# Patient Record
Sex: Male | Born: 1950 | Race: White | Hispanic: No | Marital: Married | Smoking: Former smoker
Health system: Southern US, Community
[De-identification: ages and names within clinical notes are randomized; demographics above are authoritative.]

## PROBLEM LIST (undated history)

## (undated) DIAGNOSIS — M1990 Unspecified osteoarthritis, unspecified site: Secondary | ICD-10-CM

## (undated) DIAGNOSIS — J449 Chronic obstructive pulmonary disease, unspecified: Secondary | ICD-10-CM

## (undated) DIAGNOSIS — G4730 Sleep apnea, unspecified: Secondary | ICD-10-CM

## (undated) DIAGNOSIS — K439 Ventral hernia without obstruction or gangrene: Secondary | ICD-10-CM

## (undated) DIAGNOSIS — K219 Gastro-esophageal reflux disease without esophagitis: Secondary | ICD-10-CM

## (undated) DIAGNOSIS — I10 Essential (primary) hypertension: Secondary | ICD-10-CM

## (undated) HISTORY — PX: VARICOSE VEIN SURGERY: SHX832

## (undated) HISTORY — PX: EYE SURGERY: SHX253

## (undated) HISTORY — PX: ROTATOR CUFF REPAIR: SHX139

## (undated) HISTORY — PX: BACK SURGERY: SHX140

## (undated) HISTORY — PX: ULNAR NERVE REPAIR: SHX2594

## (undated) HISTORY — PX: CARDIAC CATHETERIZATION: SHX172

---

## 1998-07-27 ENCOUNTER — Ambulatory Visit (HOSPITAL_COMMUNITY): Admission: RE | Admit: 1998-07-27 | Discharge: 1998-07-28 | Payer: Self-pay | Admitting: *Deleted

## 2000-04-02 ENCOUNTER — Ambulatory Visit (HOSPITAL_COMMUNITY): Admission: RE | Admit: 2000-04-02 | Discharge: 2000-04-02 | Payer: Self-pay | Admitting: Neurosurgery

## 2000-04-02 ENCOUNTER — Encounter: Payer: Self-pay | Admitting: Neurosurgery

## 2000-04-05 ENCOUNTER — Encounter: Payer: Self-pay | Admitting: Neurosurgery

## 2000-04-07 ENCOUNTER — Encounter: Payer: Self-pay | Admitting: Neurosurgery

## 2000-04-07 ENCOUNTER — Inpatient Hospital Stay (HOSPITAL_COMMUNITY): Admission: RE | Admit: 2000-04-07 | Discharge: 2000-04-10 | Payer: Self-pay | Admitting: Neurosurgery

## 2000-04-07 ENCOUNTER — Encounter (INDEPENDENT_AMBULATORY_CARE_PROVIDER_SITE_OTHER): Payer: Self-pay | Admitting: Specialist

## 2000-05-17 ENCOUNTER — Encounter: Admission: RE | Admit: 2000-05-17 | Discharge: 2000-06-15 | Payer: Self-pay | Admitting: Neurosurgery

## 2002-01-04 ENCOUNTER — Ambulatory Visit (HOSPITAL_COMMUNITY): Admission: RE | Admit: 2002-01-04 | Discharge: 2002-01-04 | Payer: Self-pay | Admitting: Internal Medicine

## 2002-05-18 ENCOUNTER — Encounter: Payer: Self-pay | Admitting: Emergency Medicine

## 2002-05-18 ENCOUNTER — Inpatient Hospital Stay (HOSPITAL_COMMUNITY): Admission: EM | Admit: 2002-05-18 | Discharge: 2002-05-21 | Payer: Self-pay | Admitting: Emergency Medicine

## 2002-05-19 ENCOUNTER — Encounter: Payer: Self-pay | Admitting: Cardiology

## 2002-05-20 ENCOUNTER — Encounter: Payer: Self-pay | Admitting: Cardiology

## 2002-06-25 ENCOUNTER — Ambulatory Visit (HOSPITAL_COMMUNITY): Admission: RE | Admit: 2002-06-25 | Discharge: 2002-06-25 | Payer: Self-pay | Admitting: Pulmonary Disease

## 2002-07-23 ENCOUNTER — Ambulatory Visit: Admission: RE | Admit: 2002-07-23 | Discharge: 2002-07-23 | Payer: Self-pay | Admitting: Pulmonary Disease

## 2002-11-11 ENCOUNTER — Ambulatory Visit (HOSPITAL_COMMUNITY): Admission: RE | Admit: 2002-11-11 | Discharge: 2002-11-11 | Payer: Self-pay | Admitting: Pulmonary Disease

## 2006-02-14 ENCOUNTER — Ambulatory Visit (HOSPITAL_COMMUNITY): Admission: RE | Admit: 2006-02-14 | Discharge: 2006-02-14 | Payer: Self-pay | Admitting: Family Medicine

## 2006-02-28 ENCOUNTER — Ambulatory Visit: Payer: Self-pay | Admitting: *Deleted

## 2006-02-28 ENCOUNTER — Ambulatory Visit (HOSPITAL_COMMUNITY): Admission: RE | Admit: 2006-02-28 | Discharge: 2006-02-28 | Payer: Self-pay | Admitting: Pulmonary Disease

## 2006-03-16 ENCOUNTER — Ambulatory Visit (HOSPITAL_COMMUNITY): Admission: RE | Admit: 2006-03-16 | Discharge: 2006-03-16 | Payer: Self-pay | Admitting: Pulmonary Disease

## 2006-03-28 ENCOUNTER — Ambulatory Visit: Payer: Self-pay | Admitting: *Deleted

## 2006-04-03 ENCOUNTER — Ambulatory Visit: Payer: Self-pay | Admitting: *Deleted

## 2006-04-03 ENCOUNTER — Inpatient Hospital Stay (HOSPITAL_BASED_OUTPATIENT_CLINIC_OR_DEPARTMENT_OTHER): Admission: RE | Admit: 2006-04-03 | Discharge: 2006-04-03 | Payer: Self-pay | Admitting: *Deleted

## 2006-04-18 ENCOUNTER — Ambulatory Visit: Payer: Self-pay | Admitting: *Deleted

## 2007-08-09 ENCOUNTER — Ambulatory Visit (HOSPITAL_COMMUNITY): Admission: RE | Admit: 2007-08-09 | Discharge: 2007-08-09 | Payer: Self-pay | Admitting: Pulmonary Disease

## 2008-01-08 ENCOUNTER — Ambulatory Visit (HOSPITAL_COMMUNITY): Admission: RE | Admit: 2008-01-08 | Discharge: 2008-01-08 | Payer: Self-pay | Admitting: Pulmonary Disease

## 2008-05-22 ENCOUNTER — Emergency Department (HOSPITAL_COMMUNITY): Admission: EM | Admit: 2008-05-22 | Discharge: 2008-05-22 | Payer: Self-pay | Admitting: Emergency Medicine

## 2010-06-08 ENCOUNTER — Emergency Department (HOSPITAL_COMMUNITY): Admission: EM | Admit: 2010-06-08 | Discharge: 2010-06-08 | Payer: Self-pay | Admitting: Emergency Medicine

## 2011-04-22 NOTE — Cardiovascular Report (Signed)
[place]. [facility]  Patient:    Corey Nunez, Corey Nunez Visit Number: [id number] MRN: [id number]          Service Type: MED Location: [facility] 2855 [id number] Attending Physician:  Jonelle Sidle Dictated by:   Rollene Rotunda, M.D. [facility] Proc. Date: 05/21/02 Admit Date:  05/18/2002 Discharge Date: 05/21/2002   CC:         Kari Baars, M.D.   Cardiac Catheterization  PROCEDURE:  Left heart catheterization and coronary angiography.  INDICATIONS:  Evaluate patient with chest pain and a Cardiolite suggesting inferior ischemia.  DESCRIPTION OF PROCEDURE:  Left heart catheterization was performed via the right femoral artery.  The artery was cannulated using anterior wall puncture. A #6 French arterial sheath was inserted via the modified Seldinger technique. Preformed Judkins and a pigtail catheter were utilized.  The patient tolerated the procedure well and left the lab in stable condition.  RESULTS:  HEMODYNAMICS:  LV 122/16, AO 121/61.  CORONARIES: 1. The left main was normal. 2. The LAD was normal. 3. The circumflex was normal. 4. The right coronary artery was nondominant and normal.  LEFT VENTRICULOGRAM:  A left ventriculogram was obtained in the RAO projection. The EF was 60% and normal.  CONCLUSION:  Normal coronary arteries.  Normal left ventricular function.  PLAN:  No further cardiovascular testing is suggested.  The patient will continue to follow with Dr. Juanetta Gosling if he has chest discomfort. Dictated by:   Rollene Rotunda, M.D. [facility] Attending Physician:  Jonelle Sidle DD:  05/21/02 TD:  05/22/02 Job: [id number]

## 2011-04-22 NOTE — Cardiovascular Report (Signed)
NAME:  Corey Nunez, Corey Nunez NO.:  [id number]   MEDICAL RECORD NO.:  [id number]          PATIENT TYPE:  OIB   LOCATION:  1961                         FACILITY:  [facility]   PHYSICIAN:  Vida Roller, M.D.   DATE OF BIRTH:  09/10/1951   DATE OF PROCEDURE:  DATE OF DISCHARGE:                              CARDIAC CATHETERIZATION   HISTORY OF PRESENT ILLNESS:  Mr. 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  He also has  significant shortness of breath.  Echocardiogram was done which showed some  mild dilatation of the right ventricle, normal LV systolic function, and the  plan was to do a right and left heart catheterization to rule out  significant cardiac disease.  The patient is moderately obese at almost 300  pounds.   PROCEDURES PERFORMED:  1.  Right heart catheterization.  2.  Left heart catheterization.  3.  Selective coronary angiography.   PROCEDURE IN DETAIL:  After obtaining informed consent, the patient was  brought to the cardiac catheterization laboratory in a fasting state.  There  he was prepped and draped in the usual sterile manner.  Local anesthetic was  obtained over the right groin using 1% lidocaine without epinephrine.  The  right femoral artery and the right femoral vein were both cannulated using  the modified Seldinger technique with a 4-French 10-cm sheath in the artery  and a 7-French 10-cm sheath in the vein.  Right heart catheterization was  performed with a Swan-Ganz catheter.  Left heart catheterization was  performed with a Judkins left #4, a 3DRC catheter and a pigtail catheter.  The pigtail catheter was used to cross the across valve was not used for  left ventriculography due to the difficulty in crossing the aortic valve and  the inability to appropriately  and safely position the catheter in the left  ventricle.  At the conclusion of the procedure, the catheters were removed.  The patient was moved back to the holding area where the femoral artery and  femoral venous sheaths were removed.  Hemostasis was obtained using direct  manual pressure.  At the conclusion of the hold, there was no evidence of  ecchymosis or hematoma formation.  Distal pulses were intact.  Total  fluoroscopic time 11.1 minutes.  Total ionized contrast 100 mL.   RESULTS:   RIGHT HEART PRESSURES:  Right atrium with an A-wave of 8, V-wave of 5, mean  of 4 mmHg.  Right ventricle 29/0 with an end-diastolic pressure of 4 mmHg.  Pulmonary artery pressure 24/10 with a mean of 16 mmHg.  Pulmonary capillary wedge pressure A-wave 12, V-wave 14, mean 11 mmHg.   LEFT HEART PRESSURES:  Aorta 92/65 with a mean of 78 mmHg.  Left ventricle 112/6 with an end-diastolic pressure of 12 mmHg.   No gradient across pullback.   SATURATIONS:  Aorta 95, pulmonary artery 72.   CARDIAC OUTPUT:  By  the Fick method 7.7 liters/minute, index of 3.1  liters/minute/m2.  By the thermodilution method, 7.4 liters/minute with an index of 3.0  liters/minute/m2.   CORONARY ANGIOGRAPHY:  Left main coronary artery is a large vessel which is  angiographically normal.   The left anterior descending coronary artery is a large vessel with one  large branching diagonal.  It bifurcates at the apex.  There is no  significant obstructive disease.   The circumflex coronary artery is a large dominant vessel with a large  obtuse marginal which branches several smaller obtuse marginals, a moderate  caliber posterior descending coronary artery and several posterolateral  branches.  It is also free of obstructive disease.   The right coronary artery is a small nondominant vessel which feeds  primarily just the right ventricular wall.   No left ventriculogram was performed.   ASSESSMENT:  1.  Nonobstructive  disease which is left dominant.  2.  Normal left ventricular systolic function on echo.  3.  Tortuous aorta.  4.  Normal right heart pressures.  5.  Normal cardiac output and cardiac index.   PLAN:  1.  Medical therapy.  2.  Consider a conditioning program.  3.  Follow up with me or PA in [place] for a groin check in two weeks.      Vida Roller, M.D.  Electronically Signed     JH/MEDQ  D:  04/03/2006  T:  04/03/2006  Job:  [id number]   cc:   Ramon Dredge L. Juanetta Gosling, M.D.  Fax: [phone]

## 2011-04-22 NOTE — Op Note (Signed)
[place] [facility]  Patient:    Corey Nunez, Corey Nunez                       MRN: [id number] Proc. Date: 04/07/00 Adm. Date:  [id number] Disc. Date: [id number] Attending:  Danella Penton                           Operative Report  PREOPERATIVE DIAGNOSIS:  Mass in the extradural space at the level of L2 and L3.  POSTOPERATIVE DIAGNOSIS:  Large fragment of disk.  PROCEDURE:  Left L3 hemilaminectomy, removal of free fragment of disk, and decompression of the L2 and L3 nerve root.  MICROSCOPE:  Midas Rex.  SURGEON:  Tanya Nones. Jeral Fruit, M.D.  ASSISTANT:  _______.  ANESTHESIA:  CLINICAL HISTORY:  The patient is a 60 year old gentleman who about five weeks go developed the sudden onset of back pain with radiation down both legs which essentially went down to the left one.  He did not get any better.  He had an MRI which showed an extradural lesion at the level of the body of L3.  We did an MRI with enhancement, and it was difficult to make a diagnosis between a synovial cyst and a tumor, or a herniated disk.  Surgery was advised.  The patient and his wife knew of the risks such as infection, CSF leak, worsening of the pain, paralysis, and need for further surgery.  DESCRIPTION OF PROCEDURE:  The patient was taken to the OR, and he was positioned a prone manner.  X-ray showed that indeed we were at the level of L3.  Then, the kin was opened and we continued our dissection through a thick layer of soft tissue  down to the lumbar fascia.  Muscle was retracted laterally.  We identified the spinous process of L3, and then we did a hemilaminectomy of L3 with removal of he yellow ligament above to L2 and L3 and down to L3 and L4.  Then, we found the dural matter and with the microscope, we did a microdissection.  Indeed, right at the  level of the body of L2 and L3, there was a herniated mass.  An incision was made and we found multiple fragments of  disk.  Total diskectomy with removal of fragments was achieved.  Decompression of the L3 and L2 nerve root was done. We investigated the L3 and L4 disk which was normal, and the L2 and L3 was opened, and there was no evidence of any opening.  Because of that, we decided not to do a diskectomy.  We went up to the foramen, and we did foraminotomy.  ________ of no abnormal areas or any more fragment.  After this, the Valsalva maneuver was negative.  The area was irrigated. Fentanyl and Depo-Medrol were left in the dural space, and the wound was closed with Vicryl and Steri-Strips.  A large piece of fat was left to cover the dural matter. DD:  04/07/00 TD:  04/10/00 Job: [id number]

## 2011-04-22 NOTE — H&P (Signed)
[place]. [facility]  Patient:    Corey Nunez, Corey Nunez                         MRN: [id number] Adm. Date:  04/07/00 Attending:  Tanya Nones. Jeral Fruit, M.D.                         History and Physical  HISTORY:  The patient is a gentleman who was seen by me a few days ago in my office because, three weeks ago, he developed sudden onset of back pain that went down to both of the legs, mostly down to the right.  He complained of some cramps.  He ad difficulty sitting and standing, and he was overall miserable.  The patient denies any problems with bowel or bladder.  He denies any weakness.  Because the pain as getting worse, an MRI was obtained.  We looked at the MRI and decided to bring im in to have an MRI with contrast.  He is being admitted because of mass in the lumbar area.  PAST MEDICAL HISTORY:  He had some nerve surgery in the left arm after an injury. He had surgery of the left shoulder.  Varicose veins, both legs.  ALLERGIES:  Vicodin, Darvocet and Lorcet.  SOCIAL HISTORY:  The patient does not smoke or drink.  FAMILY HISTORY:  His mother is 31.  His father died of aneurysm of the heart. e had a sister die of melanoma.  REVIEW OF SYSTEMS:  Positive for high blood pressure.  PHYSICAL EXAMINATION:  GENERAL:  The patient presented to my office with his wife.  He was miserable. He was not able to stand or sit.  VITAL SIGNS:  Height 6 feet.  Weight 260 pounds.  HEENT:  Normal.  NECK:  Normal.  LUNGS:  Clear.  HEART:  Heart sounds normal.  ABDOMEN:  Normal.  EXTREMITIES:  There is grade 1 edema.  There are scars in both legs from previous surgery.  NEUROLOGIC:  He is oriented x 3 with normal cranial nerves.  Strength is 5/5, although he has weakening of the left iliopsoas.  It is quite difficult to assess the weakness because of the amount of pain that he is having.  Reflexes 2+, no Babinskis.  Sensation normal. Palpation of the lumbar  spine produces some pain in the midlumbar area.  Coordination normal.  The patient has quite a bit of difficulty walking.  LABORATORY DATA:  MRI showed at the level of L2-3 right behind the body of L2, there is a large cystic area which looked like benign tumor or herniated disk. The MRI with enhancement really did not seem to help.  IMPRESSION:  Rule out benign tumor of the spinal cord in the lumbar area.  RECOMMENDATIONS:  The patient is being admitted for surgery.  We are going to proceed with laminectomy and remove the mass in the lumbar area.  The patient knows about the risks such as infection, CSF leak, worsening pain, paralysis and the eed for further surgery.  The patient is a Air traffic controller witness.   We agreed with no blood transfusion, but he agreed with [employer]. DD:  04/07/00 TD:  04/07/00 Job: [id number]

## 2011-04-22 NOTE — Op Note (Signed)
[facility]  Patient:    Corey Nunez, Corey Nunez Visit Number: [id number] MRN: [id number]          Service Type: END Location: DAY Attending Physician:  Jonathon Bellows Dictated by:   Roetta Sessions, M.D. Admit Date:  01/04/2002   CC:         Kari Baars, M.D.   Operative Report  INDICATIONS FOR PROCEDURE:  The patient is a 60 year old gentleman referred for colon cancer screening.  He is devoid of any GI symptoms.  He is referred at the courtesy of Kari Baars, M.D.  I discussed colonoscopy with [name].  The potential risks, benefits, and alternatives have been reviewed.  It is notable that there is no family of colorectal neoplasia.  Please see my handwritten H&P for more information.  O2 saturation, blood pressure, pulse, and respirations were monitored throughout the entirety of the procedure.  ANESTHESIA:  Conscious sedation with Versed 4 mg IV, Demerol 100 mg IV in divided doses.  INSTRUMENT:  Olympus video colonoscope.  FINDINGS:  Digital rectal exam revealed no abnormalities.  The prep was good.  Rectum:  Examination of the rectal mucosa including retroflexion at the anal verge revealed no abnormalities.  Colon:  Colonic mucosal surface was surveyed from the rectosigmoid junction to the left transverse right colon to the area of the appendiceal orifice, ileocecal valve, and cecum.  These structures were well-seen and photographed. The only abnormalities noted upon advancing the scope to the cecum were scattered left-sided diverticula. The remainder of the colonic mucosa appeared normal.  From the level of the cecum and ileocecal valve, the scope was slowly and cautiously withdrawn.  All previous mucosal surfaces were again seen, and no other abnormalities were observed.  The patient tolerated the procedure well and was reactive.  ENDOSCOPY IMPRESSION: 1. Normal rectum. 2. Left-sided diverticulum. 3. The remainder of colonic  mucosa appeared normal.  RECOMMENDATIONS: 1. Diverticulosis literature provided to [name]. 2. He should consider returning for repeat colonoscopy in ten years. Dictated by:   Roetta Sessions, M.D. Attending Physician:  Jonathon Bellows DD:  01/04/02 TD:  01/04/02 Job: [id number]

## 2011-04-22 NOTE — Discharge Summary (Signed)
[place]. [facility]  Patient:    Corey Nunez, Corey Nunez                       MRN: [id number] Adm. Date:  [id number] Disc. Date: [id number] Attending:  Danella Penton                           Discharge Summary  ADMITTING DIAGNOSES:  Rule out spinal cord tumor.  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  HISTORY OF PRESENT ILLNESS:  The patient was admitted because of back and leg pain.  MRI showed an ______ for tumor at the level of L2, L3.  Surgery was advised.  LABORATORY DATA:  Normal.  HOSPITAL COURSE:  The patient was taken to surgery and a left L3 hemilaminectomy was done.  What we found was that, indeed, there was a large herniated disk acting as a tumor compromising the nerve root as well as the dural sac.  Removal was done.  Today, he is doing much better, the pain is less, and there is no weakness.  He had been able to urinate without any problems.  CONDITION ON DISCHARGE:  Improvement.  MEDICATIONS:  Percocet, OxyContin, and diazepam.  DIET:  He was encouraged to lose weight.  ACTIVITY:  Not to drive for at least 10 days.  FOLLOW-UP:  He will be seen by me in 2-1/2 weeks. DD:  04/10/00 TD:  04/11/00 Job: [id number]

## 2011-04-22 NOTE — Procedures (Signed)
NAME:  Corey Nunez, Corey Nunez                ACCOUNT NO.:  [id number]   MEDICAL RECORD NO.:  [id number]          PATIENT TYPE:  OUT   LOCATION:  RESP                          FACILITY:  [facility]   PHYSICIAN:  Edward L. Juanetta Gosling, M.D.DATE OF BIRTH:  10-22-1951   DATE OF PROCEDURE:  DATE OF DISCHARGE:  03/16/2006                              PULMONARY FUNCTION TEST   PROCEDURE:  Spirometry.   PHYSICIAN:  Edward L. Juanetta Gosling, M.D.   FINDINGS:  1.  Spirometry is no ventilatory defect but the flow volume loop is somewhat      rounded in appearance.  2.  Total lung capacity is reduced to 66% noting the patient's listed height      and weight of 72 inches and 275 pounds, respectively. This could be      related to body habitus.  3.  Diffusing capacity is normal.  4.  Arterial blood gases are normal.  5.  There is no significant bronchodilator effect.      Edward L. Juanetta Gosling, M.D.  Electronically Signed     ELH/MEDQ  D:  03/24/2006  T:  03/24/2006  Job:  [id number]

## 2011-04-22 NOTE — Discharge Summary (Signed)
[place]. [facility]  Patient:    Corey Nunez, Corey Nunez Visit Number: [id number] MRN: [id number]          Service Type: MED Location: [facility] 2855 [id number] Attending Physician:  Jonelle Sidle Dictated by:   Brita Romp, P.A. Admit Date:  05/18/2002 Discharge Date: 05/21/2002   CC:         Rollene Rotunda, M.D. [facility]  Kari Baars, M.D.  [facility]   Discharge Summary  DISCHARGE DIAGNOSES: 1. Chest pain, status post cardiac catheterization. 2. Seasonal allergies. 3. Hypertension. 4. Gastroesophageal reflux disease.  HISTORY OF PRESENT ILLNESS: Corey Nunez is a 60 year old male with no known history of coronary artery disease. He presented to the hospital on May 18, 2002 complaining of being awakened by shortness of breath along with some chest tightness. He was seen and admitted by Dr. Simona Huh. Dr. Diona Browner felt that the patients chest pain was a mixture of typical and atypical features. He admitted the patient to rule out a myocardial infarction with serial enzymes and started the patient on aspirin, a beta blocker, and IV Heparin. His enzymes were negative. The patient will undergo a stress test.  HOSPITAL COURSE: On May 19, 2002, the patient underwent GXT Cardiolite stress test. Nuclear imaging revealed no ischemia and ejection fraction was 57%. Thee was noted to be a fixed defect of the inferior apex, as well as a question of some inferior apical hypokinesis. After reviewing the images, Dr. Myrtis Ser felt that a cardiac catheterization was indicated. The next day, the patient underwent a 2-D echocardiogram. Left ventricular systolic function was grossly normal with an ejection fraction of 55-65%. This study was technically difficult and felt be inadequate for the evaluation of left ventricular regional wall motion abnormalities. On May 21, 2002 the patient was taken to the cath lab by Dr. Rollene Rotunda. Catheterization revealed normal  coronary arteries with no limiting disease. Left ventriculogram showed an ejection of 60%. Dr. Antoine Poche felt that the chest pain has been likely noncardiac in nature and that no further cardiac workup was indicated. After the standard wait time, the patient was ambulated and when he was felt to be stable, he was discharged to home.  DISCHARGE MEDICATIONS: 1. Zyrtec 10 mg q.d. 2. Prinzide 20/12.5 mg q.d. 3. Nexium 40 mg q.d. 4. Ultracet as needed. 5. Testosterone as previously taken.  LABORATORY DATA: White count 6.5, hemoglobin 13.4, hematocrit 38.9, platelets 206, sodium 139, potassium 4.1, chloride 106, CO2 25, BUN 14, creatinine 1.0, glucose 94, AST 35, ALT 18, ALP 7, total bilirubin 1.3, TSH 1.675.  DIAGNOSTIC STUDIES: Chest x-ray on admission showed cardiomegaly and some chronic bronchitic changes but no acute processes. EKG showed sinus rhythm at 69, PR interval 146, QRS 88, QTC 393, axis minus 4.  DISCHARGE INSTRUCTIONS: 1. The patient is to avoid driving, heavy lifting, or tub baths for two days. 2. He is to follow a low fat, low cholesterol diet. 3. He is to watch the cath site for any pain, bleeding, or swelling and to    call the [place] office for any of these problems. 4. He is to contact Dr. Juanetta Gosling office for a follow-up appointment. Dictated by:   Brita Romp, P.A. Attending Physician:  Jonelle Sidle DD:  05/21/02 TD:  05/22/02 Job: [id number]

## 2011-04-22 NOTE — Procedures (Signed)
[facility]  Patient:    Corey Nunez, Corey Nunez Visit Number: [id number] MRN: [id number]          Service Type: OUT Location: RESP Attending Physician:  Fredirick Maudlin Dictated by:   Kari Baars, M.D. Proc. Date: 06/25/02 Admit Date:  06/25/2002 Discharge Date: 06/25/2002                      Pulmonary Function Test Inter.  RESULTS: 1. Spirometry is normal. 2. Lung volumes are normal. 3. DLCO is normal. 4. Arterial blood gases are normal. Dictated by:   Kari Baars, M.D. Attending Physician:  Fredirick Maudlin DD:  06/25/02 TD:  06/29/02 Job: [id number]
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
## 2011-04-22 NOTE — Procedures (Signed)
NAME:  Corey Nunez, Corey Nunez                ACCOUNT NO.:  [id number]   MEDICAL RECORD NO.:  [id number]          PATIENT TYPE:  OUT   LOCATION:  RAD                           FACILITY:  [facility]   PHYSICIAN:  Vida Roller, M.D.   DATE OF BIRTH:  Oct 24, 1951   DATE OF PROCEDURE:  02/28/2006  DATE OF DISCHARGE:                                  ECHOCARDIOGRAM   TAPE NUMBER:  [id number].  Tape count [id number].   INDICATIONS:  This is an evaluation for LV systolic function in a man with  increasing shortness of breath.  Technical quality of the study is extremely  difficult.  We were unable to get subcostal views.  Contrast was used to  opacify the left ventricular cavity.   M-MODE TRACINGS:  Aorta is 42 mm.   Left atrium is 44 mm.   The septum is 15 mm.   Posterior wall is 13 mm.   Left ventricular diastolic dimension is 49 mm.   Left ventricular systolic dimension is 32 mm.   Two-dimensional echocardiogram and Doppler imaging:  The left ventricle is  normal size.  There appears to be preserved LV systolic function.  There are  no obvious wall motion abnormalities on the contrast study.  There appears  to be mild concentric left ventricular hypertrophy.   Right ventricle is dilated in some views, although many of the views are  nonstandard.  The systolic function appears to be preserved.   Both atria appear to be dilated on the limited views that are available.   There is no obvious valvular heart disease.   The ascending aorta was measured to be slightly larger than is normal and it  was not well-seen on any views except the parasternal long axis and did not  appear to me to be substantially dilated.  However, very limited study.  Clinical correlation is advised.      Vida Roller, M.D.  Electronically Signed     JH/MEDQ  D:  02/28/2006  T:  03/02/2006  Job:  [id number]

## 2011-10-19 ENCOUNTER — Telehealth: Payer: Self-pay

## 2011-10-19 NOTE — Telephone Encounter (Signed)
Pt received a letter that it is time to schedule his next colonoscopy. His last one was done on 01/04/2002. So he has decided to wait til the first of the year and schedule for end of Jan. ( He is on my call back list).

## 2011-12-13 ENCOUNTER — Telehealth: Payer: Self-pay

## 2011-12-13 NOTE — Telephone Encounter (Signed)
Pt to call back on Wed with complete med list and insurance info.

## 2011-12-27 ENCOUNTER — Other Ambulatory Visit: Payer: Self-pay

## 2011-12-27 DIAGNOSIS — Z139 Encounter for screening, unspecified: Secondary | ICD-10-CM

## 2011-12-28 NOTE — Telephone Encounter (Signed)
OK as is.

## 2011-12-28 NOTE — Telephone Encounter (Signed)
Gastroenterology Pre-Procedure Form   Request Date: 12/27/2011        Requesting Physician: Dr. Jena Gauss     PATIENT INFORMATION:  Corey Nunez is a 61 y.o., male (DOB=November 14, 1951).  PROCEDURE: Procedure(s) requested: colonoscopy Procedure Reason: screening for colon cancer  PATIENT REVIEW QUESTIONS: The patient reports the following:   1. Diabetes Melitis: no 2. Joint replacements in the past 12 months: no 3. Major health problems in the past 3 months: no 4. Has an artificial valve or MVP:no 5. Has been advised in past to take antibiotics in advance of a procedure like teeth cleaning: no}    MEDICATIONS & ALLERGIES:    Patient reports the following regarding taking any blood thinners:   Plavix? no Aspirin?no Coumadin?  no  Patient confirms/reports the following medications:  Current Outpatient Prescriptions  Medication Sig Dispense Refill  . lisinopril (PRINIVIL,ZESTRIL) 10 MG tablet Take 10 mg by mouth daily.      . NON FORMULARY Vitamin C .....one tablet daily      . NON FORMULARY Fish Oil..............one tablet daily      . omeprazole (PRILOSEC) 20 MG capsule Take 20 mg by mouth daily.        Patient confirms/reports the following allergies:  No Known Allergies  Patient is appropriate to schedule for requested procedure(s): yes  AUTHORIZATION INFORMATION Primary Insurance:   ID #:   Group #:  Pre-Cert / Auth required Pre-Cert / Auth #:   Secondary Insurance:   ID #:   Group #:  Pre-Cert / Auth required:  Pre-Cert / Auth #:   No orders of the defined types were placed in this encounter.    SCHEDULE INFORMATION: Procedure has been scheduled as follows:  Date: 01/10/2012        Time: 11:15 AM  Location: [facility] Short Stay  This Gastroenterology Pre-Precedure Form is being routed to the following provider(s) for review: R. Roetta Sessions, MD

## 2011-12-29 NOTE — Telephone Encounter (Signed)
Rx and instructions mailed.  

## 2011-12-30 ENCOUNTER — Encounter (HOSPITAL_COMMUNITY): Payer: Self-pay | Admitting: Pharmacy Technician

## 2012-01-09 MED ORDER — SODIUM CHLORIDE 0.45 % IV SOLN
Freq: Once | INTRAVENOUS | Status: AC
  Administered 2012-01-10: 1000 mL via INTRAVENOUS

## 2012-01-10 ENCOUNTER — Other Ambulatory Visit: Payer: Self-pay | Admitting: Internal Medicine

## 2012-01-10 ENCOUNTER — Encounter (HOSPITAL_COMMUNITY): Payer: Self-pay | Admitting: *Deleted

## 2012-01-10 ENCOUNTER — Ambulatory Visit (HOSPITAL_COMMUNITY)
Admission: RE | Admit: 2012-01-10 | Discharge: 2012-01-10 | Disposition: A | Discharge status: Home / Self Care | Payer: Medicare PPO | Source: Ambulatory Visit | Attending: Internal Medicine | Admitting: Internal Medicine

## 2012-01-10 ENCOUNTER — Encounter (HOSPITAL_COMMUNITY)
Admission: RE | Disposition: A | Discharge status: Home / Self Care | Payer: Self-pay | Source: Ambulatory Visit | Attending: Internal Medicine

## 2012-01-10 DIAGNOSIS — J449 Chronic obstructive pulmonary disease, unspecified: Secondary | ICD-10-CM | POA: Insufficient documentation

## 2012-01-10 DIAGNOSIS — J4489 Other specified chronic obstructive pulmonary disease: Secondary | ICD-10-CM | POA: Insufficient documentation

## 2012-01-10 DIAGNOSIS — D126 Benign neoplasm of colon, unspecified: Secondary | ICD-10-CM | POA: Insufficient documentation

## 2012-01-10 DIAGNOSIS — Z1211 Encounter for screening for malignant neoplasm of colon: Secondary | ICD-10-CM

## 2012-01-10 DIAGNOSIS — K5730 Diverticulosis of large intestine without perforation or abscess without bleeding: Secondary | ICD-10-CM | POA: Insufficient documentation

## 2012-01-10 DIAGNOSIS — Z79899 Other long term (current) drug therapy: Secondary | ICD-10-CM | POA: Insufficient documentation

## 2012-01-10 DIAGNOSIS — D128 Benign neoplasm of rectum: Secondary | ICD-10-CM

## 2012-01-10 DIAGNOSIS — Z139 Encounter for screening, unspecified: Secondary | ICD-10-CM

## 2012-01-10 DIAGNOSIS — D129 Benign neoplasm of anus and anal canal: Secondary | ICD-10-CM

## 2012-01-10 DIAGNOSIS — I10 Essential (primary) hypertension: Secondary | ICD-10-CM | POA: Insufficient documentation

## 2012-01-10 HISTORY — PX: COLONOSCOPY: SHX5424

## 2012-01-10 HISTORY — DX: Sleep apnea, unspecified: G47.30

## 2012-01-10 HISTORY — DX: Ventral hernia without obstruction or gangrene: K43.9

## 2012-01-10 HISTORY — DX: Gastro-esophageal reflux disease without esophagitis: K21.9

## 2012-01-10 HISTORY — DX: Essential (primary) hypertension: I10

## 2012-01-10 HISTORY — DX: Unspecified osteoarthritis, unspecified site: M19.90

## 2012-01-10 HISTORY — DX: Chronic obstructive pulmonary disease, unspecified: J44.9

## 2012-01-10 SURGERY — COLONOSCOPY
Anesthesia: Moderate Sedation

## 2012-01-10 MED ORDER — STERILE WATER FOR IRRIGATION IR SOLN
Status: DC | PRN
  Administered 2012-01-10: 12:00:00

## 2012-01-10 MED ORDER — MEPERIDINE HCL 100 MG/ML IJ SOLN
INTRAMUSCULAR | Status: AC
  Filled 2012-01-10: qty 2

## 2012-01-10 MED ORDER — MEPERIDINE HCL 100 MG/ML IJ SOLN
INTRAMUSCULAR | Status: DC | PRN
  Administered 2012-01-10: 25 mg via INTRAVENOUS
  Administered 2012-01-10: 50 mg via INTRAVENOUS
  Administered 2012-01-10: 25 mg via INTRAVENOUS

## 2012-01-10 MED ORDER — MIDAZOLAM HCL 5 MG/5ML IJ SOLN
INTRAMUSCULAR | Status: DC | PRN
  Administered 2012-01-10 (×2): 1 mg via INTRAVENOUS
  Administered 2012-01-10 (×2): 2 mg via INTRAVENOUS
  Administered 2012-01-10: 1 mg via INTRAVENOUS

## 2012-01-10 MED ORDER — MIDAZOLAM HCL 5 MG/5ML IJ SOLN
INTRAMUSCULAR | Status: AC
  Filled 2012-01-10: qty 10

## 2012-01-10 NOTE — H&P (Signed)
Primary Care Physician:  Fredirick Maudlin, MD, MD Primary Gastroenterologist:  Dr. Jena Gauss  Pre-Procedure History & Physical: HPI:  Corey Nunez is a 61 y.o. male is here for a screening colonoscopy. Last examination 10 years -  Diverticulosis.  No bowel symptoms. No family history of polyps or colon cancer.  Past Medical History  Diagnosis Date  . Hypertension   . COPD (chronic obstructive pulmonary disease)   . Sleep apnea   . GERD (gastroesophageal reflux disease)   . Arthritis   . Ventral hernia     Past Surgical History  Procedure Date  . Varicose vein surgery     stripping  . Back surgery   . Ulnar nerve repair '95  . Rotator cuff repair     left  . Cardiac catheterization 10 yrs ago  . Eye surgery     Prior to Admission medications   Medication Sig Start Date End Date Taking? Authorizing Provider  lisinopril-hydrochlorothiazide (PRINZIDE,ZESTORETIC) 20-12.5 MG per tablet Take 1 tablet by mouth daily.   Yes Historical Provider, MD  omega-3 acid ethyl esters (LOVAZA) 1 G capsule Take 1 g by mouth daily.   Yes Historical Provider, MD  omeprazole (PRILOSEC) 20 MG capsule Take 20 mg by mouth daily.   Yes Historical Provider, MD  traMADol (ULTRAM) 50 MG tablet Take 50 mg by mouth every 6 (six) hours as needed. For pain   Yes Historical Provider, MD  vitamin C (ASCORBIC ACID) 500 MG tablet Take 500 mg by mouth 2 (two) times daily.   Yes Historical Provider, MD    Allergies as of 12/27/2011  . (No Known Allergies)    History reviewed. No pertinent family history.  History   Social History  . Marital Status: Married    Spouse Name: N/A    Number of Children: N/A  . Years of Education: N/A   Occupational History  . Not on file.   Social History Main Topics  . Smoking status: Never Smoker   . Smokeless tobacco: Not on file  . Alcohol Use: Yes     occ. wine  . Drug Use: No  . Sexually Active:    Other Topics Concern  . Not on file   Social History  Narrative  . No narrative on file    Review of Systems: See HPI, otherwise negative ROS  Physical Exam: BP 164/108  Temp(Src) 97.7 F (36.5 C) (Oral)  Resp 12  Ht 5\' 11"  (1.803 m)  Wt 290 lb (131.543 kg)  BMI 40.45 kg/m2  SpO2 95% General:   Alert,  Well-developed, well-nourished, pleasant and cooperative in NAD Head:  Normocephalic and atraumatic. Eyes:  Sclera clear, no icterus.   Conjunctiva pink. Ears:  Normal auditory acuity. Nose:  No deformity, discharge,  or lesions. Mouth:  No deformity or lesions, dentition normal. Neck:  Supple; no masses or thyromegaly. Lungs:  Clear throughout to auscultation.   No wheezes, crackles, or rhonchi. No acute distress. Heart:  Regular rate and rhythm; no murmurs, clicks, rubs,  or gallops. Abdomen:  Obese. Positive bowel sounds large ventral hernia. No appreciable mass or organomegaly Msk:  Symmetrical without gross deformities. Normal posture. Pulses:  Normal pulses noted. Extremities:  Without clubbing or edema. Neurologic:  Alert and  oriented x4;  grossly normal neurologically. Skin:  Intact without significant lesions or rashes. Cervical Nodes:  No significant cervical adenopathy. Psych:  Alert and cooperative. Normal mood and affect.  Impression/Plan: Corey Nunez is now here to undergo a  screening colonoscopy.  Average risk screening examination.  The risks, benefits, limitations, imponderables and alternatives regarding colonoscopy have been reviewed with the patient. Questions have been answered. All parties agreeable.

## 2012-01-10 NOTE — Op Note (Signed)
[facility] [address] [place], [place]  [address]  COLONOSCOPY PROCEDURE REPORT  PATIENT:  Corey Nunez, Corey Nunez  MR#:  [id number] BIRTHDATE:  1951-01-26, 60 yrs. old  GENDER:  male ENDOSCOPIST:  R. Roetta Sessions, MD FACP [facility] REF. BY:  Kari Baars, M.D. PROCEDURE DATE:  01/10/2012 PROCEDURE:  colonoscopy with polyp ablation and biopsy  INDICATIONS:  average risk screening colonoscopy  INFORMED CONSENT:  The risks, benefits, alternatives and imponderables including but not limited to bleeding, perforation as well as the possibility of a missed lesion have been reviewed. The potential for biopsy, lesion removal, etc. have also been discussed.  Questions have been answered.  All parties agreeable. Please see the history and physical in the medical record for more information.  MEDICATIONS:  Versed 7 mg IV and Demerol 100 mg IV in divided doses.  DESCRIPTION OF PROCEDURE:  After a digital rectal exam was performed, the EC-[id number] ([id number]) colonoscope was advanced from the anus through the rectum and colon to the area of the cecum, ileocecal valve and appendiceal orifice.  The cecum was deeply intubated.  These structures were well-seen and photographed for the record.  From the level of the cecum and ileocecal valve, the scope was slowly and cautiously withdrawn.  The mucosal surfaces were carefully surveyed utilizing scope tip deflection to facilitate fold flattening as needed.  The scope was pulled down into the rectum where a thorough examination including retroflexion was performed. <<PROCEDUREIMAGES>>  FINDINGS: adequate preparation.  Left-sided diverticulosis; single diminutive ascending colon polyp and  multiple hyperplastic appearing polyps at         the  rectosigmoid junction.  THERAPEUTIC / DIAGNOSTIC MANEUVERS PERFORMED:  the rectosigmoid polyps were ablated with the hot snare cautery loop. The ascending colon polyp was cold  biopsied/removed.  COMPLICATIONS:  none  CECAL WITHDRAWAL TIME:   15 minutes  IMPRESSION:     Colonic polyps-treated as described above. Colonic diverticulosis.  RECOMMENDATIONS:     Followup on pathology report  ______________________________ R. Roetta Sessions, MD Caleen Essex  CC:  Kari Baars, M.D.  n. eSIGNED:   R. Roetta Sessions at 01/10/2012 12:05 PM  Dellie Burns, [phone]

## 2012-01-12 ENCOUNTER — Encounter: Payer: Self-pay | Admitting: Internal Medicine

## 2012-01-13 ENCOUNTER — Encounter (HOSPITAL_COMMUNITY): Payer: Self-pay | Admitting: Internal Medicine

## 2013-04-13 ENCOUNTER — Observation Stay (HOSPITAL_COMMUNITY)
Admission: EM | Admit: 2013-04-13 | Discharge: 2013-04-15 | Disposition: A | Discharge status: Home / Self Care | Payer: Medicare PPO | Attending: Pulmonary Disease | Admitting: Pulmonary Disease

## 2013-04-13 ENCOUNTER — Encounter (HOSPITAL_COMMUNITY): Payer: Self-pay | Admitting: *Deleted

## 2013-04-13 ENCOUNTER — Emergency Department (HOSPITAL_COMMUNITY): Payer: Medicare PPO

## 2013-04-13 DIAGNOSIS — I10 Essential (primary) hypertension: Secondary | ICD-10-CM | POA: Insufficient documentation

## 2013-04-13 DIAGNOSIS — G4730 Sleep apnea, unspecified: Secondary | ICD-10-CM | POA: Insufficient documentation

## 2013-04-13 DIAGNOSIS — J449 Chronic obstructive pulmonary disease, unspecified: Secondary | ICD-10-CM | POA: Insufficient documentation

## 2013-04-13 DIAGNOSIS — R002 Palpitations: Secondary | ICD-10-CM | POA: Insufficient documentation

## 2013-04-13 DIAGNOSIS — I872 Venous insufficiency (chronic) (peripheral): Secondary | ICD-10-CM | POA: Insufficient documentation

## 2013-04-13 DIAGNOSIS — R519 Headache, unspecified: Secondary | ICD-10-CM | POA: Diagnosis present

## 2013-04-13 DIAGNOSIS — M1990 Unspecified osteoarthritis, unspecified site: Secondary | ICD-10-CM | POA: Diagnosis present

## 2013-04-13 DIAGNOSIS — J4489 Other specified chronic obstructive pulmonary disease: Secondary | ICD-10-CM | POA: Insufficient documentation

## 2013-04-13 DIAGNOSIS — J019 Acute sinusitis, unspecified: Secondary | ICD-10-CM | POA: Insufficient documentation

## 2013-04-13 DIAGNOSIS — R079 Chest pain, unspecified: Secondary | ICD-10-CM | POA: Insufficient documentation

## 2013-04-13 DIAGNOSIS — R279 Unspecified lack of coordination: Principal | ICD-10-CM | POA: Insufficient documentation

## 2013-04-13 DIAGNOSIS — M129 Arthropathy, unspecified: Secondary | ICD-10-CM | POA: Insufficient documentation

## 2013-04-13 DIAGNOSIS — J0191 Acute recurrent sinusitis, unspecified: Secondary | ICD-10-CM | POA: Diagnosis present

## 2013-04-13 DIAGNOSIS — R270 Ataxia, unspecified: Secondary | ICD-10-CM | POA: Diagnosis present

## 2013-04-13 DIAGNOSIS — R2990 Unspecified symptoms and signs involving the nervous system: Secondary | ICD-10-CM | POA: Diagnosis present

## 2013-04-13 DIAGNOSIS — R51 Headache: Secondary | ICD-10-CM | POA: Insufficient documentation

## 2013-04-13 DIAGNOSIS — K219 Gastro-esophageal reflux disease without esophagitis: Secondary | ICD-10-CM | POA: Insufficient documentation

## 2013-04-13 LAB — CBC WITH DIFFERENTIAL/PLATELET
Basophils Absolute: 0 10*3/uL (ref 0.0–0.1)
Basophils Relative: 0 % (ref 0–1)
MCHC: 34.8 g/dL (ref 30.0–36.0)
Neutro Abs: 5.6 10*3/uL (ref 1.7–7.7)
Neutrophils Relative %: 57 % (ref 43–77)
RDW: 13.1 % (ref 11.5–15.5)

## 2013-04-13 MED ORDER — METOCLOPRAMIDE HCL 5 MG/ML IJ SOLN
10.0000 mg | Freq: Once | INTRAMUSCULAR | Status: AC
  Administered 2013-04-13: 10 mg via INTRAVENOUS
  Filled 2013-04-13: qty 2

## 2013-04-13 MED ORDER — DIPHENHYDRAMINE HCL 50 MG/ML IJ SOLN
25.0000 mg | Freq: Once | INTRAMUSCULAR | Status: AC
  Administered 2013-04-13: 25 mg via INTRAVENOUS
  Filled 2013-04-13: qty 1

## 2013-04-13 MED ORDER — ASPIRIN 81 MG PO CHEW
324.0000 mg | CHEWABLE_TABLET | Freq: Once | ORAL | Status: AC
  Administered 2013-04-13: 324 mg via ORAL
  Filled 2013-04-13: qty 4

## 2013-04-13 NOTE — ED Notes (Signed)
EDP into pts room right after I left.

## 2013-04-13 NOTE — ED Provider Notes (Signed)
History     This chart was scribed for Dione Booze, MD by Jiles Prows, ED Scribe. The patient was seen in room [facility]10/[facility]10 and the patient's care was started at 11:10 PM.  CSN: [id number]  Arrival date & time 04/13/13  2237   Chief Complaint  Patient presents with  . Chest Pain   The history is provided by the patient, medical records and a significant other. No language interpreter was used.   HPI Comments: Corey Nunez is a 62 y.o. male with a history of COPD, GERD, and heart catheritization who presents to the Emergency Department complaining of 2 episodes of heart palpitations that occurred within the past 9 hours. The first palpitation episodes occurred around 4 pm and 7  pm. Each lasted 4-5 minutes. Pt states it felt like his heart was fluttering and "jumping out of chest."  Pt is also complaining of a sharp parietal headache that began around the same time.  Pt reports feeling weakness, nausea, diaphoresis, chest pain, right facial numbness, and tingling in both hands.  Pt notes that his headache is exacerbated with movement and bothered by light and noise. There is associated peripheral leg swelling.  He reports flashes in his field of vision.  Pt denies  diaphoresis, fever, chills, vomiting, diarrhea, cough, SOB and any other pain. Pt took 2 aleve around 2:30 pm with no relief.  Pt is a former smoker. PCP is Dr. Juanetta Gosling. Past Medical History  Diagnosis Date  . Hypertension   . COPD (chronic obstructive pulmonary disease)   . Sleep apnea   . GERD (gastroesophageal reflux disease)   . Arthritis   . Ventral hernia     Past Surgical History  Procedure Laterality Date  . Varicose vein surgery      stripping  . Back surgery    . Ulnar nerve repair  '95  . Rotator cuff repair      left  . Cardiac catheterization  10 yrs ago  . Eye surgery    . Colonoscopy  01/10/2012    Procedure: COLONOSCOPY;  Surgeon: Corbin Ade, MD;  Location: [facility];  Service: Endoscopy;   Laterality: N/A;  11:15 AM    History reviewed. No pertinent family history.  History  Substance Use Topics  . Smoking status: Never Smoker   . Smokeless tobacco: Not on file  . Alcohol Use: Yes     Comment: occ. wine      Review of Systems  HENT: Negative for nosebleeds, congestion and rhinorrhea.   Cardiovascular: Positive for chest pain and palpitations.  Neurological: Positive for weakness and headaches.  All other systems reviewed and are negative.    Allergies  Review of patient's allergies indicates no known allergies.  Home Medications   Current Outpatient Rx  Name  Route  Sig  Dispense  Refill  . lisinopril-hydrochlorothiazide (PRINZIDE,ZESTORETIC) 20-12.5 MG per tablet   Oral   Take 1 tablet by mouth daily.         Marland Kitchen omega-3 acid ethyl esters (LOVAZA) 1 G capsule   Oral   Take 1 g by mouth daily.         Marland Kitchen omeprazole (PRILOSEC) 20 MG capsule   Oral   Take 20 mg by mouth daily.         . traMADol (ULTRAM) 50 MG tablet   Oral   Take 50 mg by mouth every 6 (six) hours as needed. For pain         .  vitamin C (ASCORBIC ACID) 500 MG tablet   Oral   Take 500 mg by mouth 2 (two) times daily.           BP 141/94  Pulse 67  Temp(Src) 98.7 F (37.1 C) (Oral)  Resp 18  Ht 6' (1.829 m)  Wt 257 lb (116.574 kg)  BMI 34.85 kg/m2  SpO2 98%  Physical Exam  Constitutional: He is oriented to person, place, and time. He appears well-developed and well-nourished.  HENT:  Head: Normocephalic and atraumatic.  Mouth/Throat: Oropharynx is clear and moist.  Eyes: Conjunctivae and EOM are normal. Pupils are equal, round, and reactive to light.  Fundi are normal.  Neck: Normal range of motion. Neck supple. Carotid bruit is not present.  Cardiovascular: Normal rate, regular rhythm and normal heart sounds.   No murmur heard. Pulmonary/Chest: Effort normal and breath sounds normal. No respiratory distress.  Abdominal: Soft. Bowel sounds are normal. There  is no tenderness.  Musculoskeletal: Normal range of motion. He exhibits edema.  +1 pretibial edema.  Neurological: He is alert and oriented to person, place, and time.  Skin: Skin is warm and dry. No rash noted.    ED Course  Procedures (including critical care time) DIAGNOSTIC STUDIES: Oxygen Saturation is 98% on RA, normal by my interpretation.    COORDINATION OF CARE: 11:18 PM - Discussed ED treatment with pt at bedside including HA pain management, heart enzyme test, and follow up with PCP and pt agrees.    Results for orders placed during the hospital encounter of 04/13/13  CBC WITH DIFFERENTIAL      Result Value Range   WBC 9.7  4.0 - 10.5 K/uL   RBC 4.64  4.22 - 5.81 MIL/uL   Hemoglobin 14.6  13.0 - 17.0 g/dL   HCT 16.1  09.6 - 04.5 %   MCV 90.3  78.0 - 100.0 fL   MCH 31.5  26.0 - 34.0 pg   MCHC 34.8  30.0 - 36.0 g/dL   RDW 40.9  81.1 - 91.4 %   Platelets 220  150 - 400 K/uL   Neutrophils Relative % 57  43 - 77 %   Neutro Abs 5.6  1.7 - 7.7 K/uL   Lymphocytes Relative 31  12 - 46 %   Lymphs Abs 3.0  0.7 - 4.0 K/uL   Monocytes Relative 8  3 - 12 %   Monocytes Absolute 0.8  0.1 - 1.0 K/uL   Eosinophils Relative 4  0 - 5 %   Eosinophils Absolute 0.4  0.0 - 0.7 K/uL   Basophils Relative 0  0 - 1 %   Basophils Absolute 0.0  0.0 - 0.1 K/uL  BASIC METABOLIC PANEL      Result Value Range   Sodium 136  135 - 145 mEq/L   Potassium 3.2 (*) 3.5 - 5.1 mEq/L   Chloride 98  96 - 112 mEq/L   CO2 27  19 - 32 mEq/L   Glucose, Bld 103 (*) 70 - 99 mg/dL   BUN 15  6 - 23 mg/dL   Creatinine, Ser 7.82  0.50 - 1.35 mg/dL   Calcium 9.3  8.4 - 95.6 mg/dL   GFR calc non Af Amer 87 (*) >90 mL/min   GFR calc Af Amer >90  >90 mL/min  TROPONIN I      Result Value Range   Troponin I <0.30  <0.30 ng/mL  HEPATIC FUNCTION PANEL      Result Value Range  Total Protein 7.2  6.0 - 8.3 g/dL   Albumin 3.6  3.5 - 5.2 g/dL   AST 23  0 - 37 U/L   ALT 14  0 - 53 U/L   Alkaline Phosphatase 86   39 - 117 U/L   Total Bilirubin 0.4  0.3 - 1.2 mg/dL   Bilirubin, Direct 0.1  0.0 - 0.3 mg/dL   Indirect Bilirubin 0.3  0.3 - 0.9 mg/dL  TROPONIN I      Result Value Range   Troponin I <0.30  <0.30 ng/mL  PROTIME-INR      Result Value Range   Prothrombin Time 13.2  11.6 - 15.2 seconds   INR 1.01  0.00 - 1.49  APTT      Result Value Range   aPTT 28  24 - 37 seconds  URINALYSIS, ROUTINE W REFLEX MICROSCOPIC      Result Value Range   Color, Urine YELLOW  YELLOW   APPearance CLEAR  CLEAR   Specific Gravity, Urine 1.020  1.005 - 1.030   pH 6.0  5.0 - 8.0   Glucose, UA NEGATIVE  NEGATIVE mg/dL   Hgb urine dipstick NEGATIVE  NEGATIVE   Bilirubin Urine NEGATIVE  NEGATIVE   Ketones, ur NEGATIVE  NEGATIVE mg/dL   Protein, ur NEGATIVE  NEGATIVE mg/dL   Urobilinogen, UA 0.2  0.0 - 1.0 mg/dL   Nitrite NEGATIVE  NEGATIVE   Leukocytes, UA NEGATIVE  NEGATIVE  HEMOGLOBIN A1C      Result Value Range   Hemoglobin A1C 5.6  <5.7 %   Mean Plasma Glucose 114  <117 mg/dL  LIPID PANEL      Result Value Range   Cholesterol 133  0 - 200 mg/dL   Triglycerides 95  <409 mg/dL   HDL 38 (*) >81 mg/dL   Total CHOL/HDL Ratio 3.5     VLDL 19  0 - 40 mg/dL   LDL Cholesterol 76  0 - 99 mg/dL  TROPONIN I      Result Value Range   Troponin I <0.30  <0.30 ng/mL  URINE RAPID DRUG SCREEN (HOSP PERFORMED)      Result Value Range   Opiates NONE DETECTED  NONE DETECTED   Cocaine NONE DETECTED  NONE DETECTED   Benzodiazepines NONE DETECTED  NONE DETECTED   Amphetamines NONE DETECTED  NONE DETECTED   Tetrahydrocannabinol NONE DETECTED  NONE DETECTED   Barbiturates NONE DETECTED  NONE DETECTED  GLUCOSE, CAPILLARY      Result Value Range   Glucose-Capillary 102 (*) 70 - 99 mg/dL   Comment 1 Notify RN     Comment 2 Documented in Chart    GLUCOSE, CAPILLARY      Result Value Range   Glucose-Capillary 94  70 - 99 mg/dL   Comment 1 Documented in Chart     Comment 2 Notify RN    VITAMIN B12      Result Value  Range   Vitamin B-12 424  211 - 911 pg/mL  TSH      Result Value Range   TSH 2.807  0.350 - 4.500 uIU/mL  HOMOCYSTEINE      Result Value Range   Homocysteine 11.6  4.0 - 15.4 umol/L  GLUCOSE, CAPILLARY      Result Value Range   Glucose-Capillary 76  70 - 99 mg/dL   Comment 1 Documented in Chart     Comment 2 Notify RN     Ct Head Wo Contrast  04/14/2013   *  RADIOLOGY REPORT*  Clinical Data: Dizziness.  CT HEAD WITHOUT CONTRAST  Technique:  Contiguous axial images were obtained from the base of the skull through the vertex without contrast.  Comparison: None.  Findings: The brain appears normal without infarct, hemorrhage, mass lesion, mass effect, midline shift or abnormal extra-axial fluid collection.  No hydrocephalus or pneumocephalus.  The calvarium is intact.  IMPRESSION: Negative exam.   Original Report Authenticated By: Holley Dexter, M.D.    Dg Chest Portable 1 View  04/13/2013   *RADIOLOGY REPORT*  Clinical Data: Shortness of breath.  PORTABLE CHEST - 1 VIEW  Comparison: PA and lateral chest 06/08/2010.  Findings: Lungs are clear.  Heart size is normal.  No pneumothorax or pleural effusion.  No focal bony abnormality.  IMPRESSION: No acute disease.   Original Report Authenticated By: Holley Dexter, M.D.     ECG shows normal sinus rhythm with a rate of 67, no ectopy. Normal axis. Normal P wave. Normal QRS. Normal intervals. Normal ST and T waves. Impression: normal ECG. Compared with ECG of 05/18/2002, no significant change is seen.   1. Stroke-like symptoms   2. HTN (hypertension)       MDM  2 episodes of palpitations which seem most consistent with proximal supraventricular tachycardia. Possibility of other arrhythmias exists. He will be kept on a cardiac monitor but will probably need Holter monitor or event monitor as an outpatient. Headache of uncertain cause but seems likely to be a migraine. And will be treated with a headache cocktail. Cardiac markers will be  obtained and repeated to make sure that he does not have a troponin bump of 6 hours after last episode of chest heaviness. Old records are reviewed and he did have a heart catheterization 2007 which showed no obstructive disease.  Patient was reevaluated at 1 AM and states that his headache has improved but he now tells the that when he walks, he feels like he is falling to the left. This is been going on for 4 days but is getting worse. He feels like his hearing is decreased. There's no nausea or vomiting. For detailed neurologic exam was done and on finger to nose testing, he has significant ataxia with the right. Romberg shows that he is falling to the left. No nystagmus is seen. This is worrisome for posterior circulation stroke. However, he is well outside the therapeutic will window for thrombolytics and well outside the window for code stroke. He will be sent for CT scan and likely will need admission for further evaluation. Also, potassium is come back low at 3.2 and is given a dose of oral potassium.    I personally performed the services described in this documentation, which was scribed in my presence. The recorded information has been reviewed and is accurate.     Dione Booze, MD 04/18/13 [phone]

## 2013-04-13 NOTE — ED Notes (Signed)
Pt reporting fluttering feeling in chest all day, reports pain earlier, but none at present.  Reporting dizziness with exertion.  Reports some nausea earlier during flutter episode.

## 2013-04-14 ENCOUNTER — Encounter (HOSPITAL_COMMUNITY): Payer: Self-pay | Admitting: *Deleted

## 2013-04-14 ENCOUNTER — Observation Stay (HOSPITAL_COMMUNITY): Payer: Medicare PPO

## 2013-04-14 ENCOUNTER — Emergency Department (HOSPITAL_COMMUNITY): Payer: Medicare PPO

## 2013-04-14 DIAGNOSIS — R42 Dizziness and giddiness: Secondary | ICD-10-CM

## 2013-04-14 DIAGNOSIS — J0191 Acute recurrent sinusitis, unspecified: Secondary | ICD-10-CM | POA: Diagnosis present

## 2013-04-14 DIAGNOSIS — I872 Venous insufficiency (chronic) (peripheral): Secondary | ICD-10-CM | POA: Diagnosis present

## 2013-04-14 DIAGNOSIS — R2990 Unspecified symptoms and signs involving the nervous system: Secondary | ICD-10-CM | POA: Diagnosis present

## 2013-04-14 DIAGNOSIS — R51 Headache: Secondary | ICD-10-CM | POA: Diagnosis present

## 2013-04-14 DIAGNOSIS — K219 Gastro-esophageal reflux disease without esophagitis: Secondary | ICD-10-CM | POA: Diagnosis present

## 2013-04-14 DIAGNOSIS — M1990 Unspecified osteoarthritis, unspecified site: Secondary | ICD-10-CM | POA: Diagnosis present

## 2013-04-14 DIAGNOSIS — R270 Ataxia, unspecified: Secondary | ICD-10-CM | POA: Diagnosis present

## 2013-04-14 DIAGNOSIS — I10 Essential (primary) hypertension: Secondary | ICD-10-CM | POA: Diagnosis present

## 2013-04-14 DIAGNOSIS — J449 Chronic obstructive pulmonary disease, unspecified: Secondary | ICD-10-CM | POA: Diagnosis present

## 2013-04-14 DIAGNOSIS — R519 Headache, unspecified: Secondary | ICD-10-CM | POA: Diagnosis present

## 2013-04-14 DIAGNOSIS — G4730 Sleep apnea, unspecified: Secondary | ICD-10-CM | POA: Diagnosis present

## 2013-04-14 LAB — URINALYSIS, ROUTINE W REFLEX MICROSCOPIC
Bilirubin Urine: NEGATIVE
Hgb urine dipstick: NEGATIVE
Ketones, ur: NEGATIVE mg/dL
Protein, ur: NEGATIVE mg/dL
Urobilinogen, UA: 0.2 mg/dL (ref 0.0–1.0)

## 2013-04-14 LAB — BASIC METABOLIC PANEL
Chloride: 98 mEq/L (ref 96–112)
Creatinine, Ser: 0.97 mg/dL (ref 0.50–1.35)
GFR calc Af Amer: >90 mL/min (ref >90)
GFR calc non Af Amer: 87 mL/min — ABNORMAL LOW (ref >90)
Potassium: 3.2 mEq/L — ABNORMAL LOW (ref 3.5–5.1)

## 2013-04-14 LAB — TROPONIN I
Troponin I: <0.3 ng/mL (ref <0.30)
Troponin I: <0.3 ng/mL (ref <0.30)

## 2013-04-14 LAB — RAPID URINE DRUG SCREEN, HOSP PERFORMED
Amphetamines: NOT DETECTED
Barbiturates: NOT DETECTED
Benzodiazepines: NOT DETECTED
Cocaine: NOT DETECTED
Tetrahydrocannabinol: NOT DETECTED

## 2013-04-14 LAB — HEPATIC FUNCTION PANEL
AST: 23 U/L (ref 0–37)
Albumin: 3.6 g/dL (ref 3.5–5.2)
Bilirubin, Direct: 0.1 mg/dL (ref 0.0–0.3)

## 2013-04-14 LAB — LIPID PANEL
HDL: 38 mg/dL — ABNORMAL LOW (ref >39)
LDL Cholesterol: 76 mg/dL (ref 0–99)
Total CHOL/HDL Ratio: 3.5 RATIO

## 2013-04-14 LAB — PROTIME-INR
INR: 1.01 (ref 0.00–1.49)
Prothrombin Time: 13.2 seconds (ref 11.6–15.2)

## 2013-04-14 IMAGING — US US CAROTID DUPLEX BILAT
1 series · 13 of 24 positions shown · non-contrast
Comparison: None.

CLINICAL DATA: TIA and history of hypertension.

BILATERAL CAROTID DUPLEX ULTRASOUND
TECHNIQUE: Gray scale imaging, color Doppler and duplex ultrasound
was performed of bilateral carotid and vertebral arteries in the
neck.

[Series 1: us carotid duplex bilat · 0.07mm/px · 13 of 75 slices shown]
[im 1/75]
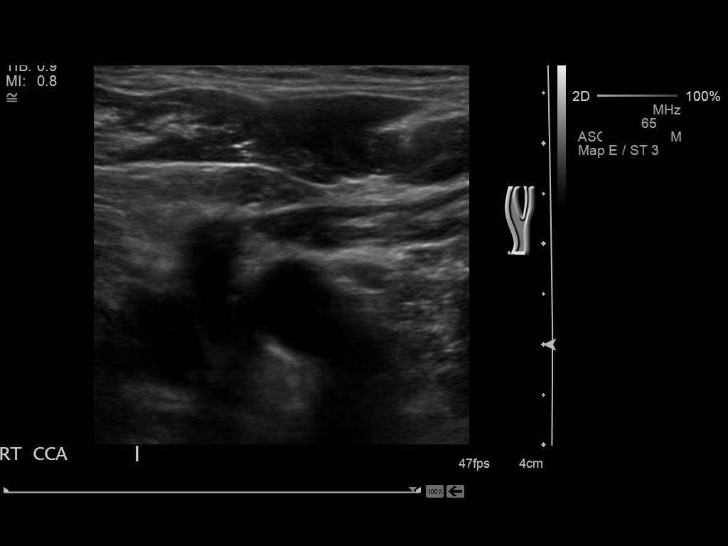
[im 7/75]
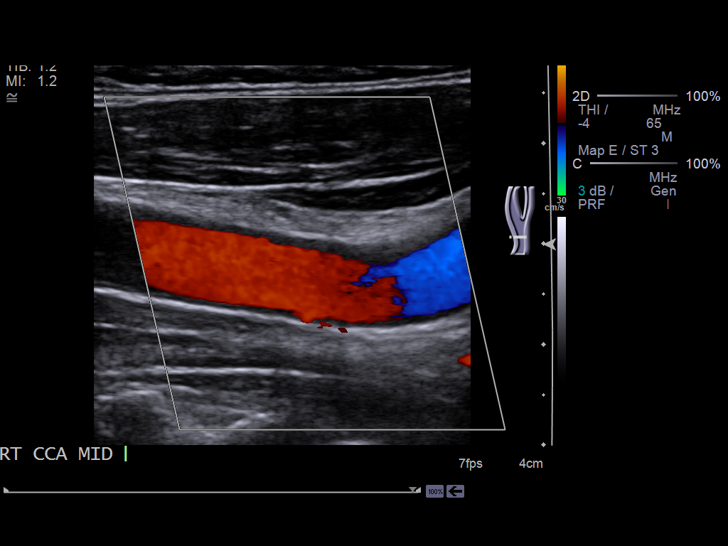
[im 13/75]
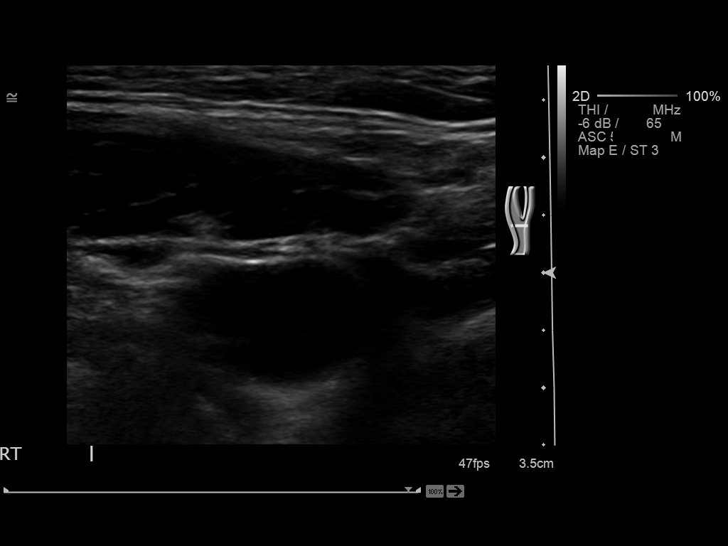
[im 20/75]
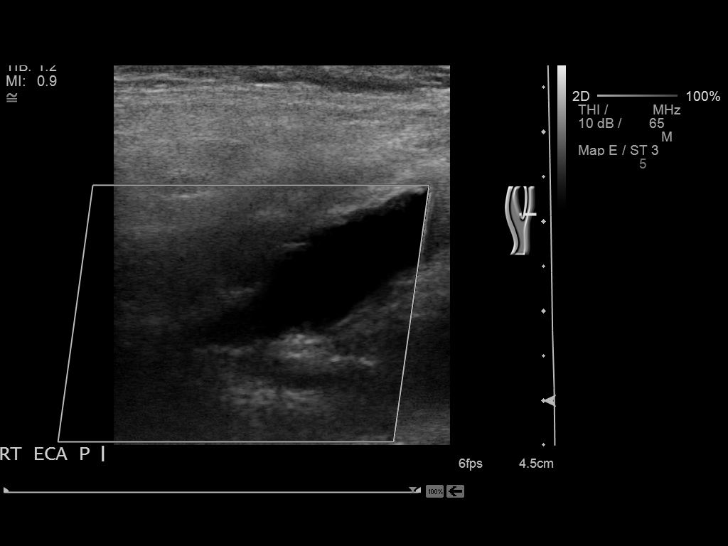
[im 26/75]
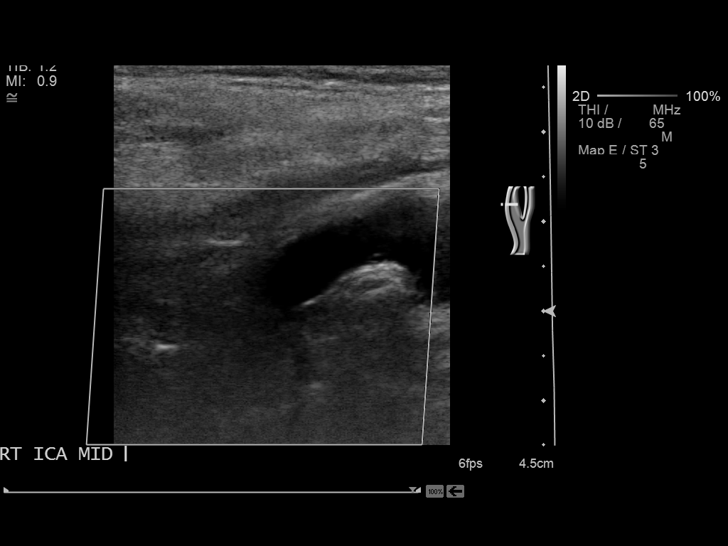
[im 33/75]
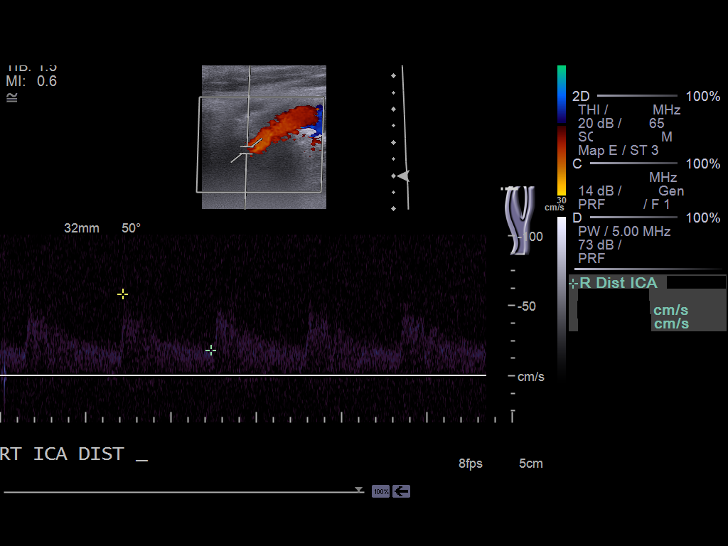
[im 39/75]
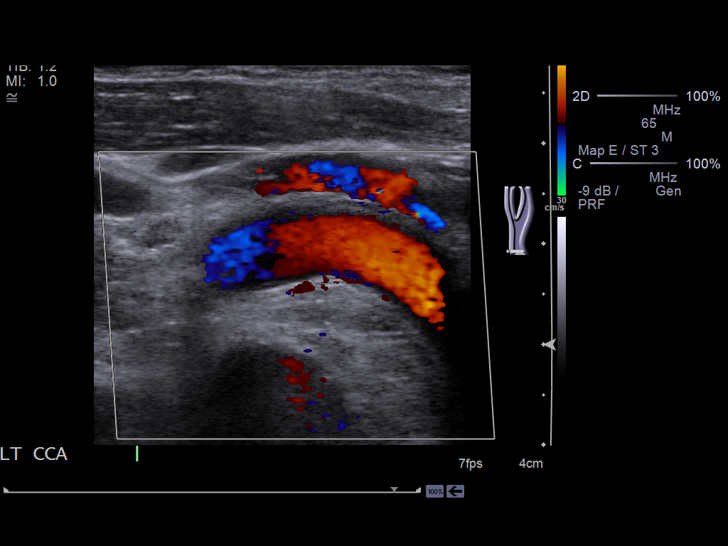
[im 42/75]
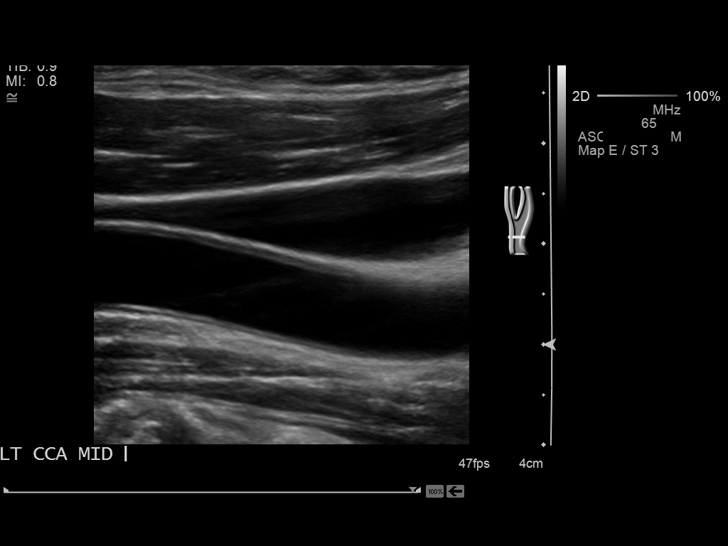
[im 49/75]
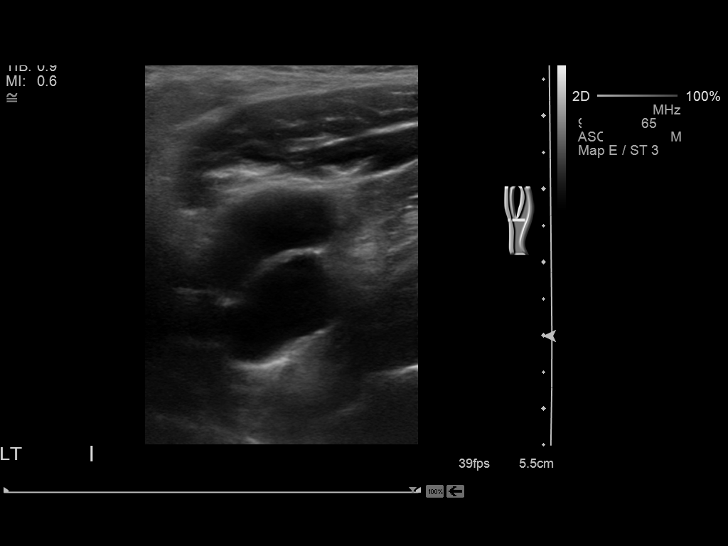
[im 55/75]
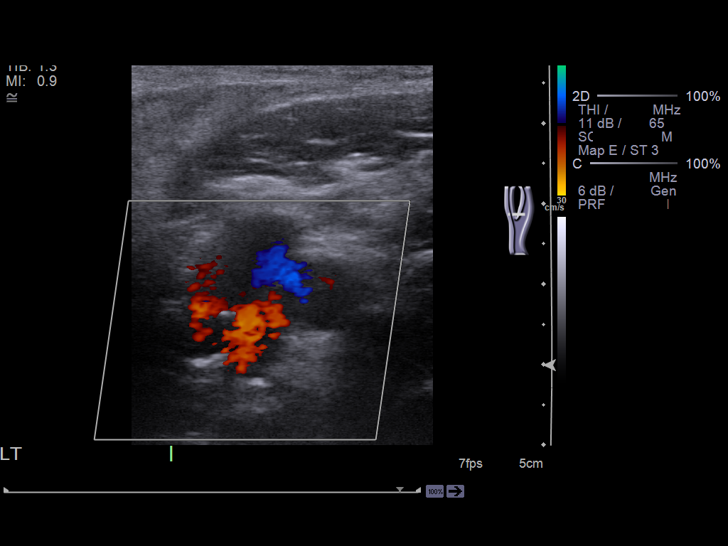
[im 62/75]
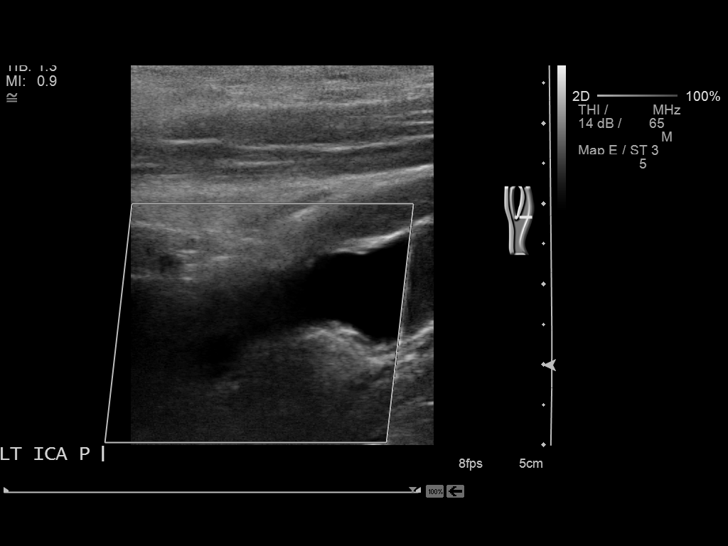
[im 68/75]
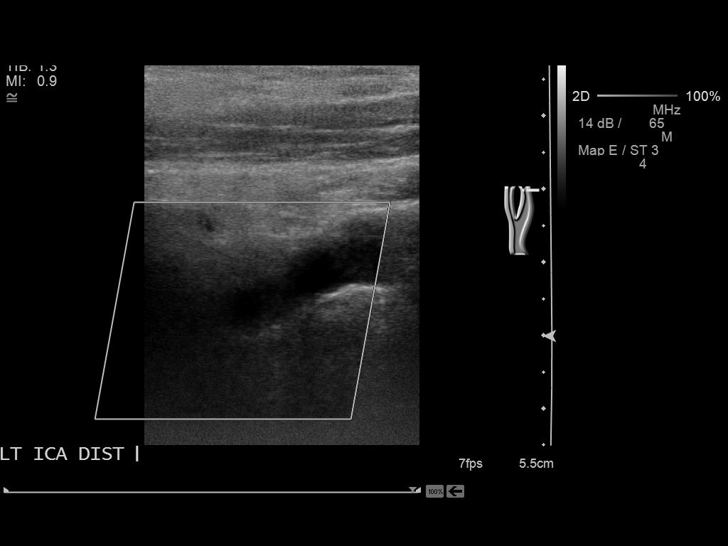
[im 75/75]
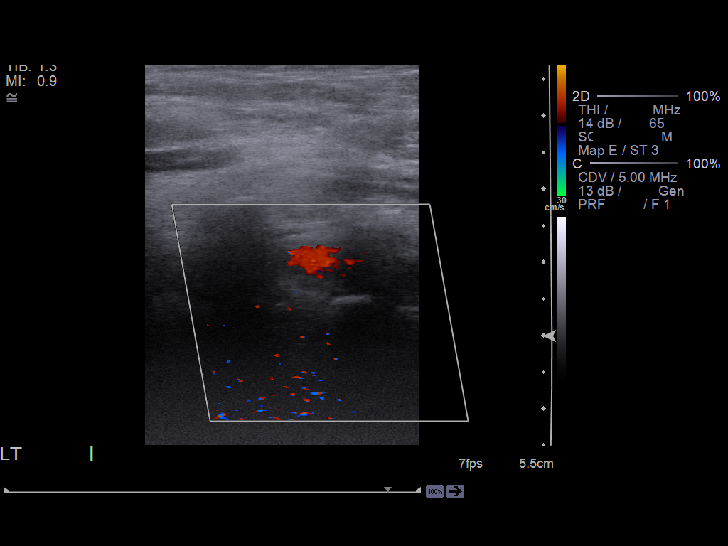

[13 of 24 positions shown; findings below may reference images not displayed]

Criteria:  Quantification of carotid stenosis is based on velocity
parameters that correlate the residual internal carotid diameter
with NASCET-based stenosis levels, using the diameter of the distal
internal carotid lumen as the denominator for stenosis measurement.

The following velocity measurements were obtained:

                 PEAK SYSTOLIC/END DIASTOLIC
RIGHT
ICA:                        72/22cm/sec
CCA:                        104/22cm/sec
SYSTOLIC ICA/CCA RATIO:
DIASTOLIC ICA/CCA RATIO:
ECA:                        77cm/sec

LEFT
ICA:                        67/23cm/sec
CCA:                        103/24cm/sec
SYSTOLIC ICA/CCA RATIO:
DIASTOLIC ICA/CCA RATIO:
ECA:                        67cm/sec
FINDINGS: RIGHT CAROTID ARTERY: There is a mild amount of predominately
noncalcified plaque at the level of the carotid bulb and proximal
ICA.  Estimated right ICA stenosis is less than 50%.

RIGHT VERTEBRAL ARTERY:  Antegrade flow with normal wave form.

LEFT CAROTID ARTERY: Carotid arteries are extremely tortuous in the
neck.  Partially calcified plaque is present at the level of the
bulb and proximal ICA.  Estimated left ICA stenosis is less than
50%.

LEFT VERTEBRAL ARTERY:  Antegrade flow with normal wave form.
IMPRESSION: Bilateral atherosclerotic plaque, left greater than right.
Tortuosity of the carotid arteries is present related to underlying
hypertension.  Estimated bilateral ICA stenoses are less than 50%.

## 2013-04-14 MED ORDER — SODIUM CHLORIDE 0.9 % IJ SOLN: 3.0000 mL | INTRAMUSCULAR | Status: DC | PRN

## 2013-04-14 MED ORDER — OMEGA-3-ACID ETHYL ESTERS 1 G PO CAPS
1.0000 g | ORAL_CAPSULE | Freq: Every day | ORAL | Status: DC
  Administered 2013-04-14 – 2013-04-15 (×2): 1 g via ORAL
  Filled 2013-04-14 (×2): qty 1

## 2013-04-14 MED ORDER — AMOXICILLIN-POT CLAVULANATE 875-125 MG PO TABS
1.0000 | ORAL_TABLET | Freq: Two times a day (BID) | ORAL | Status: DC
  Administered 2013-04-14 – 2013-04-15 (×3): 1 via ORAL
  Filled 2013-04-14 (×3): qty 1

## 2013-04-14 MED ORDER — HYDROCHLOROTHIAZIDE 12.5 MG PO CAPS
12.5000 mg | ORAL_CAPSULE | Freq: Every day | ORAL | Status: DC
  Administered 2013-04-14 – 2013-04-15 (×2): 12.5 mg via ORAL
  Filled 2013-04-14 (×2): qty 1

## 2013-04-14 MED ORDER — LISINOPRIL-HYDROCHLOROTHIAZIDE 20-12.5 MG PO TABS: 1.0000 | ORAL_TABLET | Freq: Every day | ORAL | Status: DC

## 2013-04-14 MED ORDER — ASPIRIN 325 MG PO TABS
325.0000 mg | ORAL_TABLET | Freq: Every day | ORAL | Status: DC
  Administered 2013-04-15: 325 mg via ORAL
  Filled 2013-04-14 (×2): qty 1

## 2013-04-14 MED ORDER — SODIUM CHLORIDE 0.9 % IV SOLN: 250.0000 mL | INTRAVENOUS | Status: DC | PRN

## 2013-04-14 MED ORDER — ONDANSETRON HCL 4 MG PO TABS
4.0000 mg | ORAL_TABLET | ORAL | Status: DC | PRN
  Administered 2013-04-14: 4 mg via ORAL

## 2013-04-14 MED ORDER — ACETAMINOPHEN 325 MG PO TABS: 650.0000 mg | ORAL_TABLET | ORAL | Status: DC | PRN

## 2013-04-14 MED ORDER — SALINE SPRAY 0.65 % NA SOLN
1.0000 | NASAL | Status: DC | PRN
  Filled 2013-04-14: qty 44

## 2013-04-14 MED ORDER — POTASSIUM CHLORIDE CRYS ER 20 MEQ PO TBCR
40.0000 meq | EXTENDED_RELEASE_TABLET | Freq: Once | ORAL | Status: AC
  Administered 2013-04-14: 40 meq via ORAL
  Filled 2013-04-14: qty 2

## 2013-04-14 MED ORDER — LISINOPRIL 10 MG PO TABS
20.0000 mg | ORAL_TABLET | Freq: Every day | ORAL | Status: DC
  Administered 2013-04-14 – 2013-04-15 (×2): 20 mg via ORAL
  Filled 2013-04-14: qty 2
  Filled 2013-04-14 (×2): qty 1

## 2013-04-14 MED ORDER — POTASSIUM CHLORIDE CRYS ER 20 MEQ PO TBCR: 40.0000 meq | EXTENDED_RELEASE_TABLET | Freq: Once | ORAL | Status: DC

## 2013-04-14 MED ORDER — PANTOPRAZOLE SODIUM 40 MG PO TBEC
40.0000 mg | DELAYED_RELEASE_TABLET | Freq: Every day | ORAL | Status: DC
Start: 2013-04-14 — End: 2013-04-15
  Administered 2013-04-14 – 2013-04-15 (×2): 40 mg via ORAL
  Filled 2013-04-14 (×2): qty 1

## 2013-04-14 MED ORDER — HYDROCODONE-ACETAMINOPHEN 5-325 MG PO TABS
1.0000 | ORAL_TABLET | ORAL | Status: DC | PRN
  Administered 2013-04-14 – 2013-04-15 (×4): 1 via ORAL
  Filled 2013-04-14 (×4): qty 1

## 2013-04-14 MED ORDER — LORATADINE 10 MG PO TABS
10.0000 mg | ORAL_TABLET | Freq: Every day | ORAL | Status: DC
  Administered 2013-04-14 – 2013-04-15 (×2): 10 mg via ORAL
  Filled 2013-04-14 (×2): qty 1

## 2013-04-14 MED ORDER — LORAZEPAM 1 MG PO TABS
1.0000 mg | ORAL_TABLET | Freq: Every evening | ORAL | Status: DC | PRN
  Administered 2013-04-14: 1 mg via ORAL
  Filled 2013-04-14: qty 1

## 2013-04-14 MED ORDER — SODIUM CHLORIDE 0.9 % IJ SOLN
3.0000 mL | Freq: Two times a day (BID) | INTRAMUSCULAR | Status: DC
  Administered 2013-04-14: 3 mL via INTRAVENOUS

## 2013-04-14 MED ORDER — TRAMADOL HCL 50 MG PO TABS: 50.0000 mg | ORAL_TABLET | Freq: Four times a day (QID) | ORAL | Status: DC | PRN

## 2013-04-14 MED ORDER — VITAMIN C 500 MG PO TABS
500.0000 mg | ORAL_TABLET | Freq: Two times a day (BID) | ORAL | Status: DC
  Administered 2013-04-14 – 2013-04-15 (×3): 500 mg via ORAL
  Filled 2013-04-14 (×3): qty 1

## 2013-04-14 NOTE — ED Notes (Signed)
Attempted to call report to [place]. She was assisting a pt to the bedside commode will call right back.

## 2013-04-14 NOTE — Progress Notes (Signed)
Subjective:  Patient was admitted last night due to dizziness, loss of balance and stroke like symptoms.  Objective: Vital signs in last 24 hours: Temp:  [97.6 F (36.4 C)-98.7 F (37.1 C)] 98 F (36.7 C) (05/11 1451) Pulse Rate:  [53-69] 63 (05/11 1451) Resp:  [14-19] 18 (05/11 1451) BP: (111-141)/(71-94) 128/78 mmHg (05/11 1451) SpO2:  [89 %-98 %] 96 % (05/11 1451) Weight:  [116.574 kg (257 lb)] 116.574 kg (257 lb) (05/11 0429) Weight change:  Last BM Date: 04/13/13  Intake/Output from previous day: 05/10 0701 - 05/11 0700 In: 240 [P.O.:240] Out: -   PHYSICAL EXAM General appearance: alert and no distress Resp: clear to auscultation bilaterally Cardio: S1, S2 normal GI: soft, non-tender; bowel sounds normal; no masses,  no organomegaly Extremities: extremities normal, atraumatic, no cyanosis or edema  Lab Results:    @labtest @ [facility] No results found for this basename: PHART, PCO2, PO2ART, TCO2, HCO3,  in the last 72 hours CULTURES No results found for this or any previous visit (from the past 240 hour(s)). Studies/Results: Ct Head Wo Contrast  04/14/2013  *RADIOLOGY REPORT*  Clinical Data: Dizziness.  CT HEAD WITHOUT CONTRAST  Technique:  Contiguous axial images were obtained from the base of the skull through the vertex without contrast.  Comparison: None.  Findings: The brain appears normal without infarct, hemorrhage, mass lesion, mass effect, midline shift or abnormal extra-axial fluid collection.  No hydrocephalus or pneumocephalus.  The calvarium is intact.  IMPRESSION: Negative exam.   Original Report Authenticated By: Holley Dexter, M.D.    US Carotid Duplex Bilateral  04/14/2013  *RADIOLOGY REPORT*  Clinical Data: TIA and history of hypertension.  BILATERAL CAROTID DUPLEX ULTRASOUND  Technique: Wallace Cullens scale imaging, color Doppler and duplex ultrasound was performed of bilateral carotid and vertebral arteries in the neck.  Comparison:  None.  Criteria:   Quantification of carotid stenosis is based on velocity parameters that correlate the residual internal carotid diameter with NASCET-based stenosis levels, using the diameter of the distal internal carotid lumen as the denominator for stenosis measurement.  The following velocity measurements were obtained:                   PEAK SYSTOLIC/END DIASTOLIC RIGHT ICA:                        72/22cm/sec CCA:                        104/22cm/sec SYSTOLIC ICA/CCA RATIO:     0.7 DIASTOLIC ICA/CCA RATIO:    1.0 ECA:                        77cm/sec  LEFT ICA:                        67/23cm/sec CCA:                        103/24cm/sec SYSTOLIC ICA/CCA RATIO:     0.7 DIASTOLIC ICA/CCA RATIO:    1.0 ECA:                        67cm/sec  Findings:  RIGHT CAROTID ARTERY: There is a mild amount of predominately noncalcified plaque at the level of the carotid bulb and proximal ICA.  Estimated right ICA stenosis is less than 50%.  RIGHT  VERTEBRAL ARTERY:  Antegrade flow with normal wave form.  LEFT CAROTID ARTERY: Carotid arteries are extremely tortuous in the neck.  Partially calcified plaque is present at the level of the bulb and proximal ICA.  Estimated left ICA stenosis is less than 50%.  LEFT VERTEBRAL ARTERY:  Antegrade flow with normal wave form.  IMPRESSION: Bilateral atherosclerotic plaque, left greater than right. Tortuosity of the carotid arteries is present related to underlying hypertension.  Estimated bilateral ICA stenoses are less than 50%.   Original Report Authenticated By: Irish Lack, M.D.    Dg Chest Portable 1 View  04/13/2013  *RADIOLOGY REPORT*  Clinical Data: Shortness of breath.  PORTABLE CHEST - 1 VIEW  Comparison: PA and lateral chest 06/08/2010.  Findings: Lungs are clear.  Heart size is normal.  No pneumothorax or pleural effusion.  No focal bony abnormality.  IMPRESSION: No acute disease.   Original Report Authenticated By: Holley Dexter, M.D.     Medications: I have reviewed the patient's  current medications.  Assesment:  Principal Problem:   Stroke-like symptoms Active Problems:   Ataxia   Headache   HTN (hypertension)   COPD (chronic obstructive pulmonary disease)   GERD (gastroesophageal reflux disease)   Sleep apnea   Arthritis   Venous insufficiency   Acute recurrent sinusitis    Plan: Continue telemetry and neuro check MRI in AM Neurology consult.    LOS: 1 day   Tiara Maultsby 04/14/2013, 3:57 PM

## 2013-04-14 NOTE — H&P (Signed)
[facility] History and Physical  POOKELA SELLIN QMV:[id number] DOB: [birth year] DOA: 04/13/2013  Referring physician: Etheleen Mayhew PCP: Fredirick Maudlin, MD  Specialists: none  Chief Complaint: Dizziness  HPI: Corey Nunez is a 62 y.o. male with hypertension, COPD, sleep apnea and chronic sinusitis who presented to the emergency department complaining of worsening dizziness and gait disturbance. He describes intermittent dizziness associated with nausea, he feels as if he's going to lose his balance and states that he "pulls to the left" when he walks. He denies any vision changes or visual disturbances, he does have a mild frontal headache and has recently been treated for an acute sinusitis with Cipro, he also reports having frequent episodes of bronchitis. He states that he has recently been exercising regularly and that he has lost 40 pounds he goes to the gym and walks on a treadmill. He states that for the past week he has been having some numbness and weakness in his right hand that is associated with intense itching of his right palm.   Review of Systems: The patient denies anorexia, fever, unintentional weight loss,, vision loss, decreased hearing, hoarseness, chest pain, syncope, dyspnea on exertion, peripheral edema, balance deficits, hemoptysis, abdominal pain, melena, hematochezia, severe indigestion/heartburn, hematuria, incontinence, genital sores, muscle weakness, suspicious skin lesions, transient blindness, difficulty walking, depression, unusual weight change, abnormal bleeding, enlarged lymph nodes, angioedema, and breast masses.   Past Medical History  Diagnosis Date  . Hypertension   . COPD (chronic obstructive pulmonary disease)   . Sleep apnea   . GERD (gastroesophageal reflux disease)   . Arthritis   . Ventral hernia    Past Surgical History  Procedure Laterality Date  . Varicose vein surgery      stripping  . Back surgery    . Ulnar nerve repair  '95  .  Rotator cuff repair      left  . Cardiac catheterization  10 yrs ago  . Eye surgery    . Colonoscopy  01/10/2012    Procedure: COLONOSCOPY;  Surgeon: Corbin Ade, MD;  Location: [facility];  Service: Endoscopy;  Laterality: N/A;  11:15 AM   Social History:  reports that he has quit smoking. He has quit using smokeless tobacco. He reports that  drinks alcohol. He reports that he does not use illicit drugs.   No Known Allergies  History reviewed. No pertinent family history.   Prior to Admission medications   Medication Sig Start Date End Date Taking? Authorizing Provider  lisinopril-hydrochlorothiazide (PRINZIDE,ZESTORETIC) 20-12.5 MG per tablet Take 1 tablet by mouth daily.    Historical Provider, MD  omega-3 acid ethyl esters (LOVAZA) 1 G capsule Take 1 g by mouth daily.    Historical Provider, MD  omeprazole (PRILOSEC) 20 MG capsule Take 20 mg by mouth daily.    Historical Provider, MD  traMADol (ULTRAM) 50 MG tablet Take 50 mg by mouth every 6 (six) hours as needed. For pain    Historical Provider, MD  vitamin C (ASCORBIC ACID) 500 MG tablet Take 500 mg by mouth 2 (two) times daily.    Historical Provider, MD   Physical Exam: Filed Vitals:   04/14/13 0114 04/14/13 0200 04/14/13 0300 04/14/13 0429  BP:  111/71 123/83 138/88  Pulse:  58 57 53  Temp: 97.6 F (36.4 C)   97.7 F (36.5 C)  TempSrc:    Oral  Resp:  14 19 18   Height:    6' (1.829 m)  Weight:  116.574 kg (257 lb)  SpO2:  94% 93% 96%     General:  Alert, well-nourished pleasant and cooperative with history and exam. He sounds congested in his nasal passages.  Eyes: Lately injected sclera  ENT: Mucous membranes, tenderness on palpation of his maxillary and frontal sinuses  Neck: No bruits  Cardiovascular: Irregular rate and rhythm no murmurs rubs or gallops  Respiratory: Scattered wheezes otherwise good air movement  Abdomen: Soft nontender  Skin: No rashes  Musculoskeletal: Normal bulk tone and  strength  Psychiatric: Normal mood and affect  Neurologic: Cranial nerves are intact except for some slight numbness light touch symmetrically on his face, no facial droop. Bilateral upper extremities or 5 out of 5 in strength with a slight restricted range of motion in his bilateral grips. Lower extremities are 5 out of 5 strength on individual muscle group testing. Abnormal gait, unstable.  Labs on Admission:  Basic Metabolic Panel:  Recent Labs Lab 04/13/13 2330  NA 136  K 3.2*  CL 98  CO2 27  GLUCOSE 103*  BUN 15  CREATININE 0.97  CALCIUM 9.3   Liver Function Tests:  Recent Labs Lab 04/14/13 0108  AST 23  ALT 14  ALKPHOS 86  BILITOT 0.4  PROT 7.2  ALBUMIN 3.6    Recent Labs Lab 04/13/13 2330  WBC 9.7  NEUTROABS 5.6  HGB 14.6  HCT 41.9  MCV 90.3  PLT 220   Cardiac Enzymes:  Recent Labs Lab 04/13/13 2330 04/14/13 0108  TROPONINI <0.30 <0.30    BNP (last 3 results) No results found for this basename: PROBNP,  in the last 8760 hours CBG: No results found for this basename: GLUCAP,  in the last 168 hours  Radiological Exams on Admission: Ct Head Wo Contrast  04/14/2013  *RADIOLOGY REPORT*  Clinical Data: Dizziness.  CT HEAD WITHOUT CONTRAST  Technique:  Contiguous axial images were obtained from the base of the skull through the vertex without contrast.  Comparison: None.  Findings: The brain appears normal without infarct, hemorrhage, mass lesion, mass effect, midline shift or abnormal extra-axial fluid collection.  No hydrocephalus or pneumocephalus.  The calvarium is intact.  IMPRESSION: Negative exam.   Original Report Authenticated By: Holley Dexter, M.D.    Dg Chest Portable 1 View  04/13/2013  *RADIOLOGY REPORT*  Clinical Data: Shortness of breath.  PORTABLE CHEST - 1 VIEW  Comparison: PA and lateral chest 06/08/2010.  Findings: Lungs are clear.  Heart size is normal.  No pneumothorax or pleural effusion.  No focal bony abnormality.  IMPRESSION:  No acute disease.   Original Report Authenticated By: Holley Dexter, M.D.     EKG: Independently reviewed. Normal sinus rhythm  Assessment/Plan Principal Problem:   Stroke-like symptoms Active Problems:   Ataxia   Headache   HTN (hypertension)   COPD (chronic obstructive pulmonary disease)   GERD (gastroesophageal reflux disease)   Sleep apnea   Arthritis   Venous insufficiency   1. Stroke-like symptoms, ataxia, dizziness/intermittent vertigo (possible posterior circulation TIA)  Admitted under TIA protocol, MRI MRA pending post carotid Dopplers. 2.   Acute on chronic sinusitis  This may be contributing to his dizziness he was recently treated with oral Cipro will start him on Augmentin and encourage saline nasal irrigation he has a prior history of deviated septum and problems with his sinuses, this is probably contributing to his frontal headache.  Augmentin and saline irrigation  3. his home medications for his chronic diseases including his hypertension, GERD and  COPD were restarted.   Disposition Plan: Admitted under observation status. TIA/Stroke rule out.  Time spent: 50 minutes  [facility] Pager [phone]  If 7PM-7AM, please contact night-coverage [email or web address] Password [facility] 04/14/2013, 5:54 AM

## 2013-04-15 ENCOUNTER — Ambulatory Visit (HOSPITAL_COMMUNITY)
Admit: 2013-04-15 | Discharge: 2013-04-15 | Disposition: A | Discharge status: Home / Self Care | Payer: Medicare PPO | Attending: Neurology | Admitting: Neurology

## 2013-04-15 ENCOUNTER — Observation Stay (HOSPITAL_COMMUNITY): Payer: Medicare PPO

## 2013-04-15 DIAGNOSIS — I517 Cardiomegaly: Secondary | ICD-10-CM

## 2013-04-15 LAB — GLUCOSE, CAPILLARY: Glucose-Capillary: 94 mg/dL (ref 70–99)

## 2013-04-15 LAB — VITAMIN B12: Vitamin B-12: 424 pg/mL (ref 211–911)

## 2013-04-15 LAB — TSH: TSH: 2.807 u[IU]/mL (ref 0.350–4.500)

## 2013-04-15 MED ORDER — AMOXICILLIN-POT CLAVULANATE 875-125 MG PO TABS: 1.0000 | ORAL_TABLET | Freq: Two times a day (BID) | ORAL | Status: AC

## 2013-04-15 NOTE — Progress Notes (Signed)
Pt report area of itching to right back.  States he removed a tick from the location approximately 1 month ago.  States that the area turned "big and red, like a spider-web".  Reports that he forgot to tell Dr. Juanetta Gosling.  Dr. Juanetta Gosling called and notified of the above.

## 2013-04-15 NOTE — Care Management Note (Signed)
    Page 1 of 1   04/15/2013     2:40:31 PM   CARE MANAGEMENT NOTE 04/15/2013  Patient:  Corey Nunez, Corey Nunez   Account Number:  [id number]  Date Initiated:  04/15/2013  Documentation initiated by:  Sharrie Rothman  Subjective/Objective Assessment:   Pt admitted from home with possible CVA. Pt lives alone and is independent with ADL's.     Action/Plan:   No CM needs noted. Pt for discharge later today.   Anticipated DC Date:  04/15/2013   Anticipated DC Plan:  HOME/SELF CARE      DC Planning Services  CM consult      Choice offered to / List presented to:             Status of service:  Completed, signed off Medicare Important Message given?  NA - LOS <3 / Initial given by admissions (If response is "NO", the following Medicare IM given date fields will be blank) Date Medicare IM given:   Date Additional Medicare IM given:    Discharge Disposition:  HOME/SELF CARE  Per UR Regulation:    If discussed at Long Length of Stay Meetings, dates discussed:    Comments:  04/15/13 1440 Arlyss Queen, RN BSN CM

## 2013-04-15 NOTE — Progress Notes (Signed)
UR chart review completed.  

## 2013-04-15 NOTE — Progress Notes (Signed)
Subjective: He says he feels better. He still has some weakness in he does not feel like he has any focal neurological problems now  Objective: Vital signs in last 24 hours: Temp:  [97.5 F (36.4 C)-98.1 F (36.7 C)] 97.5 F (36.4 C) (05/12 0500) Pulse Rate:  [53-70] 70 (05/12 0844) Resp:  [18-20] 18 (05/12 0500) BP: (124-146)/(74-89) 124/74 mmHg (05/12 0500) SpO2:  [94 %-99 %] 95 % (05/12 0844) Weight change:  Last BM Date: 04/13/13  Intake/Output from previous day: 05/11 0701 - 05/12 0700 In: 1920 [P.O.:1920] Out: -   PHYSICAL EXAM General appearance: alert, cooperative, no distress and moderately obese Resp: clear to auscultation bilaterally Cardio: regular rate and rhythm, S1, S2 normal, no murmur, click, rub or gallop GI: soft, non-tender; bowel sounds normal; no masses,  no organomegaly Extremities: extremities normal, atraumatic, no cyanosis or edema  Lab Results:    Basic Metabolic Panel:  Recent Labs  16/10/96 2330  NA 136  K 3.2*  CL 98  CO2 27  GLUCOSE 103*  BUN 15  CREATININE 0.97  CALCIUM 9.3   Liver Function Tests:  Recent Labs  04/14/13 0108  AST 23  ALT 14  ALKPHOS 86  BILITOT 0.4  PROT 7.2  ALBUMIN 3.6   No results found for this basename: LIPASE, AMYLASE,  in the last 72 hours No results found for this basename: AMMONIA,  in the last 72 hours CBC:  Recent Labs  04/13/13 2330  WBC 9.7  NEUTROABS 5.6  HGB 14.6  HCT 41.9  MCV 90.3  PLT 220   Cardiac Enzymes:  Recent Labs  04/13/13 2330 04/14/13 0108 04/14/13 0650  TROPONINI <0.30 <0.30 <0.30   BNP: No results found for this basename: PROBNP,  in the last 72 hours D-Dimer: No results found for this basename: DDIMER,  in the last 72 hours CBG:  Recent Labs  04/14/13 2119 04/15/13 0747  GLUCAP 102* 94   Hemoglobin A1C:  Recent Labs  04/14/13 0650  HGBA1C 5.6   Fasting Lipid Panel:  Recent Labs  04/14/13 0500  CHOL 133  HDL 38*  LDLCALC 76  TRIG 95   CHOLHDL 3.5   Thyroid Function Tests: No results found for this basename: TSH, T4TOTAL, FREET4, T3FREE, THYROIDAB,  in the last 72 hours Anemia Panel: No results found for this basename: VITAMINB12, FOLATE, FERRITIN, TIBC, IRON, RETICCTPCT,  in the last 72 hours Coagulation:  Recent Labs  04/14/13 0108  LABPROT 13.2  INR 1.01   Urine Drug Screen: Drugs of Abuse     Component Value Date/Time   LABOPIA NONE DETECTED 04/14/2013 0223   COCAINSCRNUR NONE DETECTED 04/14/2013 0223   LABBENZ NONE DETECTED 04/14/2013 0223   AMPHETMU NONE DETECTED 04/14/2013 0223   THCU NONE DETECTED 04/14/2013 0223   LABBARB NONE DETECTED 04/14/2013 0223    Alcohol Level: No results found for this basename: ETH,  in the last 72 hours Urinalysis:  Recent Labs  04/14/13 0235  COLORURINE YELLOW  LABSPEC 1.020  PHURINE 6.0  GLUCOSEU NEGATIVE  HGBUR NEGATIVE  BILIRUBINUR NEGATIVE  KETONESUR NEGATIVE  PROTEINUR NEGATIVE  UROBILINOGEN 0.2  NITRITE NEGATIVE  LEUKOCYTESUR NEGATIVE   Misc. Labs:  ABGS No results found for this basename: PHART, PCO2, PO2ART, TCO2, HCO3,  in the last 72 hours CULTURES No results found for this or any previous visit (from the past 240 hour(s)). Studies/Results: Ct Head Wo Contrast  04/14/2013  *RADIOLOGY REPORT*  Clinical Data: Dizziness.  CT HEAD WITHOUT CONTRAST  Technique:  Contiguous axial images were obtained from the base of the skull through the vertex without contrast.  Comparison: None.  Findings: The brain appears normal without infarct, hemorrhage, mass lesion, mass effect, midline shift or abnormal extra-axial fluid collection.  No hydrocephalus or pneumocephalus.  The calvarium is intact.  IMPRESSION: Negative exam.   Original Report Authenticated By: Holley Dexter, M.D.    US Carotid Duplex Bilateral  04/14/2013  *RADIOLOGY REPORT*  Clinical Data: TIA and history of hypertension.  BILATERAL CAROTID DUPLEX ULTRASOUND  Technique: Wallace Cullens scale imaging, color  Doppler and duplex ultrasound was performed of bilateral carotid and vertebral arteries in the neck.  Comparison:  None.  Criteria:  Quantification of carotid stenosis is based on velocity parameters that correlate the residual internal carotid diameter with NASCET-based stenosis levels, using the diameter of the distal internal carotid lumen as the denominator for stenosis measurement.  The following velocity measurements were obtained:                   PEAK SYSTOLIC/END DIASTOLIC RIGHT ICA:                        72/22cm/sec CCA:                        104/22cm/sec SYSTOLIC ICA/CCA RATIO:     0.7 DIASTOLIC ICA/CCA RATIO:    1.0 ECA:                        77cm/sec  LEFT ICA:                        67/23cm/sec CCA:                        103/24cm/sec SYSTOLIC ICA/CCA RATIO:     0.7 DIASTOLIC ICA/CCA RATIO:    1.0 ECA:                        67cm/sec  Findings:  RIGHT CAROTID ARTERY: There is a mild amount of predominately noncalcified plaque at the level of the carotid bulb and proximal ICA.  Estimated right ICA stenosis is less than 50%.  RIGHT VERTEBRAL ARTERY:  Antegrade flow with normal wave form.  LEFT CAROTID ARTERY: Carotid arteries are extremely tortuous in the neck.  Partially calcified plaque is present at the level of the bulb and proximal ICA.  Estimated left ICA stenosis is less than 50%.  LEFT VERTEBRAL ARTERY:  Antegrade flow with normal wave form.  IMPRESSION: Bilateral atherosclerotic plaque, left greater than right. Tortuosity of the carotid arteries is present related to underlying hypertension.  Estimated bilateral ICA stenoses are less than 50%.   Original Report Authenticated By: Irish Lack, M.D.    Dg Chest Portable 1 View  04/13/2013  *RADIOLOGY REPORT*  Clinical Data: Shortness of breath.  PORTABLE CHEST - 1 VIEW  Comparison: PA and lateral chest 06/08/2010.  Findings: Lungs are clear.  Heart size is normal.  No pneumothorax or pleural effusion.  No focal bony abnormality.   IMPRESSION: No acute disease.   Original Report Authenticated By: Holley Dexter, M.D.     Medications:  Prior to Admission:  Prescriptions prior to admission  Medication Sig Dispense Refill  . lisinopril-hydrochlorothiazide (PRINZIDE,ZESTORETIC) 20-12.5 MG per tablet Take 1 tablet by mouth daily.      Marland Kitchen  omega-3 acid ethyl esters (LOVAZA) 1 G capsule Take 1 g by mouth daily.      Marland Kitchen omeprazole (PRILOSEC) 20 MG capsule Take 20 mg by mouth daily.      . traMADol (ULTRAM) 50 MG tablet Take 50 mg by mouth every 6 (six) hours as needed. For pain      . vitamin C (ASCORBIC ACID) 500 MG tablet Take 500 mg by mouth 2 (two) times daily.       Scheduled: . amoxicillin-clavulanate  1 tablet Oral Q12H  . aspirin  325 mg Oral Daily  . hydrochlorothiazide  12.5 mg Oral Daily  . lisinopril  20 mg Oral Daily  . loratadine  10 mg Oral Daily  . omega-3 acid ethyl esters  1 g Oral Daily  . pantoprazole  40 mg Oral Daily  . sodium chloride  3 mL Intravenous Q12H  . vitamin C  500 mg Oral BID   Continuous:  WUJ:WJXBJY chloride, acetaminophen, HYDROcodone-acetaminophen, LORazepam, ondansetron, sodium chloride, sodium chloride, traMADol  Assesment: He had symptoms it could have been a stroke versus TIA. He says this happened after he had a sensation of cardiac arrhythmia. He has not shown any cardiac arrhythmia on the monitor here. His neurological symptoms are better. Principal Problem:   Stroke-like symptoms Active Problems:   Ataxia   Headache   HTN (hypertension)   COPD (chronic obstructive pulmonary disease)   GERD (gastroesophageal reflux disease)   Sleep apnea   Arthritis   Venous insufficiency   Acute recurrent sinusitis    Plan: He will need to have an event monitor as an outpatient. I have requested neurology consultation. I have ordered MRI of the brain.    LOS: 2 days   Yussuf Sawyers L 04/15/2013, 8:54 AM

## 2013-04-15 NOTE — Progress Notes (Signed)
AVS reviewed with patient and pt's friend (pt gave verbal approval).  Follow-up appointments reviewed with patient.  Pt to have appointment on Wednesday, Apr 17, 2013 at 10 a.m. For outpatient event monitor.  Pt verbalized understanding of d/c instructions.  Teach back method used.  Pt's IV removed.  Site WNL.  Pt transported by NT via w/c to main entrance for d/c.  Pt stable at time of d/c.

## 2013-04-15 NOTE — Progress Notes (Signed)
*  PRELIMINARY RESULTS* Echocardiogram 2D Echocardiogram has been performed.  Conrad [place] 04/15/2013, 11:05 AM

## 2013-04-15 NOTE — Progress Notes (Signed)
Pt to be discharged with event monitor as outpatient.  Pt has an appointment for [facility] for event monitor on Wednesday, Apr 17, 2013 at 10 a.m.

## 2013-04-15 NOTE — Progress Notes (Signed)
EEG completed.

## 2013-04-15 NOTE — Consult Note (Signed)
[facility] [name] [name], MD     [email or web address]          Corey Nunez is an 62 y.o. male.   ASSESSMENT/PLAN: 1. The semiology of both these episodes seems most consistent with a ischemic event of the brain likely a stroke. TIAs also possibility. Other potential etiologies includes complex partial seizures, primary cardiac event such as SVT and psychosomatic disorders. Migraine headaches is an outside possibility but seems unlikely given his age and lack of a migraine history of baseline. The patient has a brain MRI pending. Echo is also pending. He is on aspirin. EEG will also be obtained.  The patient is a 62 year old man who was eating on Saturday when he developed the acute onset of discomfort and palpitations involving the chest on the left side. He felt his heart racing as if a balloon was fluttering. The event lasted for about 5 minutes. He got up to walk around and felt lightheaded and apparently his wife reports that he was staggering and veering to the left side. The patient had a similar event about 30-40 minutes later again associated with palpitations, staggering and dizziness. This time the intensity was more severe and was associated with headaches involving the parietal areas bilaterally. The patient also reports being disoriented and confused during the second event. He was told by his family that he had drooping of the facial muscles on the lower right side. Again, the event lasted for about 5 minutes. The patient reports that his left side appears not to work and has not showed but he denies clear focal weakness. The headaches have persisted although he reports that the medications have been helpful to relieve the headaches although they tend to come back. She reports having episodic numbness of the right hand associated with itching-like sensation involving the palmar surface. This has been happening episodically for quite a while on the winter possible months.  He also reports over the last week he has been feeling fatigued and weak all over. He does report being under increased psychosocial stresses as these had several things not work in his house including his current need to transmission, as her condition not working and his refrigerator prominent work. The patient reports having baseline COPD. He does report that he did have increased shortness of breath during the events listed above on Saturday. No discrete chest pain other than palpitations. He has been losing weight. He apparently has lost about close to 50 pounds since January with increased exercise and watching what he eats. He indicates that he has not been using his CPAP machine for his underlying sleep apnea. The review of systems otherwise negative as stated in the history of present illness.  GENERAL:  The patient is a pleasant moderately overweight man who is in no acute distress  HEENT: Unremarkable.  ABDOMEN: soft  EXTREMITIES: No edema. There is significant arthritic changes of the hands bilaterally.   BACK: Unremarkable.  SKIN: Normal by inspection.    MENTAL STATUS: Alert and oriented. Speech, language and cognition are generally intact. Judgment and insight normal.   CRANIAL NERVES: Pupils are equal, round and reactive to light and accommodation; extra ocular movements are full, there is no significant nystagmus; visual fields are full; upper and lower facial muscles are normal in strength and symmetric, there is no flattening of the nasolabial folds; tongue is midline; uvula is midline; shoulder elevation is normal.  MOTOR: Normal tone, bulk and strength of the upper extremities; no pronator drift.  There is mild weakness of the left lower extremity proximally/hip flexion graded as 4/5. Dorsiflexion 5. The right lower extremity is normal.  COORDINATION: Left finger to nose is normal, right finger to nose is normal, No rest tremor; no intention tremor; no postural tremor; no  bradykinesia.  REFLEXES: Deep tendon reflexes are symmetrical and normal. Plantar responses are flexor on the right and equivocal on the left.   SENSATION: Normal to light touch and temperature.    Past Medical History  Diagnosis Date  . Hypertension   . COPD (chronic obstructive pulmonary disease)   . Sleep apnea   . GERD (gastroesophageal reflux disease)   . Arthritis   . Ventral hernia     Past Surgical History  Procedure Laterality Date  . Varicose vein surgery      stripping  . Back surgery    . Ulnar nerve repair  '95  . Rotator cuff repair      left  . Cardiac catheterization  10 yrs ago  . Eye surgery    . Colonoscopy  01/10/2012    Procedure: COLONOSCOPY;  Surgeon: Corbin Ade, MD;  Location: [facility];  Service: Endoscopy;  Laterality: N/A;  11:15 AM    History reviewed. No pertinent family history.  Social History:  reports that he has quit smoking. He has quit using smokeless tobacco. He reports that  drinks alcohol. He reports that he does not use illicit drugs.  Allergies: No Known Allergies  Medications: Prior to Admission medications   Medication Sig Start Date End Date Taking? Authorizing Provider  lisinopril-hydrochlorothiazide (PRINZIDE,ZESTORETIC) 20-12.5 MG per tablet Take 1 tablet by mouth daily.   Yes Historical Provider, MD  omega-3 acid ethyl esters (LOVAZA) 1 G capsule Take 1 g by mouth daily.   Yes Historical Provider, MD  omeprazole (PRILOSEC) 20 MG capsule Take 20 mg by mouth daily.   Yes Historical Provider, MD  traMADol (ULTRAM) 50 MG tablet Take 50 mg by mouth every 6 (six) hours as needed. For pain   Yes Historical Provider, MD  vitamin C (ASCORBIC ACID) 500 MG tablet Take 500 mg by mouth 2 (two) times daily.   Yes Historical Provider, MD    Scheduled Meds: . amoxicillin-clavulanate  1 tablet Oral Q12H  . aspirin  325 mg Oral Daily  . hydrochlorothiazide  12.5 mg Oral Daily  . lisinopril  20 mg Oral Daily  . loratadine  10 mg  Oral Daily  . omega-3 acid ethyl esters  1 g Oral Daily  . pantoprazole  40 mg Oral Daily  . sodium chloride  3 mL Intravenous Q12H  . vitamin C  500 mg Oral BID   Continuous Infusions:  PRN Meds:.sodium chloride, acetaminophen, HYDROcodone-acetaminophen, LORazepam, ondansetron, sodium chloride, sodium chloride, traMADol  Blood pressure 124/74, pulse 53, temperature 97.5 F (36.4 C), temperature source Oral, resp. rate 18, height 6' (1.829 m), weight 116.574 kg (257 lb), SpO2 99.00%.   Results for orders placed during the hospital encounter of 04/13/13 (from the past 48 hour(s))  CBC WITH DIFFERENTIAL     Status: None   Collection Time    04/13/13 11:30 PM      Result Value Range   WBC 9.7  4.0 - 10.5 K/uL   RBC 4.64  4.22 - 5.81 MIL/uL   Hemoglobin 14.6  13.0 - 17.0 g/dL   HCT 40.9  81.1 - 91.4 %   MCV 90.3  78.0 - 100.0 fL   MCH 31.5  26.0 -  34.0 pg   MCHC 34.8  30.0 - 36.0 g/dL   RDW 57.8  46.9 - 62.9 %   Platelets 220  150 - 400 K/uL   Neutrophils Relative 57  43 - 77 %   Neutro Abs 5.6  1.7 - 7.7 K/uL   Lymphocytes Relative 31  12 - 46 %   Lymphs Abs 3.0  0.7 - 4.0 K/uL   Monocytes Relative 8  3 - 12 %   Monocytes Absolute 0.8  0.1 - 1.0 K/uL   Eosinophils Relative 4  0 - 5 %   Eosinophils Absolute 0.4  0.0 - 0.7 K/uL   Basophils Relative 0  0 - 1 %   Basophils Absolute 0.0  0.0 - 0.1 K/uL  BASIC METABOLIC PANEL     Status: Abnormal   Collection Time    04/13/13 11:30 PM      Result Value Range   Sodium 136  135 - 145 mEq/L   Potassium 3.2 (*) 3.5 - 5.1 mEq/L   Chloride 98  96 - 112 mEq/L   CO2 27  19 - 32 mEq/L   Glucose, Bld 103 (*) 70 - 99 mg/dL   BUN 15  6 - 23 mg/dL   Creatinine, Ser 5.28  0.50 - 1.35 mg/dL   Calcium 9.3  8.4 - 41.3 mg/dL   GFR calc non Af Amer 87 (*) >90 mL/min   GFR calc Af Amer >90  >90 mL/min   Comment:            The eGFR has been calculated     using the CKD EPI equation.     This calculation has not been     validated in all  clinical     situations.     eGFR's persistently     <90 mL/min signify     possible Chronic Kidney Disease.  TROPONIN I     Status: None   Collection Time    04/13/13 11:30 PM      Result Value Range   Troponin I <0.30  <0.30 ng/mL   Comment:            Due to the release kinetics of cTnI,     a negative result within the first hours     of the onset of symptoms does not rule out     myocardial infarction with certainty.     If myocardial infarction is still suspected,     repeat the test at appropriate intervals.  HEPATIC FUNCTION PANEL     Status: None   Collection Time    04/14/13  1:08 AM      Result Value Range   Total Protein 7.2  6.0 - 8.3 g/dL   Albumin 3.6  3.5 - 5.2 g/dL   AST 23  0 - 37 U/L   ALT 14  0 - 53 U/L   Alkaline Phosphatase 86  39 - 117 U/L   Total Bilirubin 0.4  0.3 - 1.2 mg/dL   Bilirubin, Direct 0.1  0.0 - 0.3 mg/dL   Indirect Bilirubin 0.3  0.3 - 0.9 mg/dL  TROPONIN I     Status: None   Collection Time    04/14/13  1:08 AM      Result Value Range   Troponin I <0.30  <0.30 ng/mL   Comment:            Due to the release kinetics of cTnI,     a  negative result within the first hours     of the onset of symptoms does not rule out     myocardial infarction with certainty.     If myocardial infarction is still suspected,     repeat the test at appropriate intervals.  PROTIME-INR     Status: None   Collection Time    04/14/13  1:08 AM      Result Value Range   Prothrombin Time 13.2  11.6 - 15.2 seconds   INR 1.01  0.00 - 1.49  APTT     Status: None   Collection Time    04/14/13  1:08 AM      Result Value Range   aPTT 28  24 - 37 seconds  URINE RAPID DRUG SCREEN (HOSP PERFORMED)     Status: None   Collection Time    04/14/13  2:23 AM      Result Value Range   Opiates NONE DETECTED  NONE DETECTED   Cocaine NONE DETECTED  NONE DETECTED   Benzodiazepines NONE DETECTED  NONE DETECTED   Amphetamines NONE DETECTED  NONE DETECTED    Tetrahydrocannabinol NONE DETECTED  NONE DETECTED   Barbiturates NONE DETECTED  NONE DETECTED   Comment:            DRUG SCREEN FOR MEDICAL PURPOSES     ONLY.  IF CONFIRMATION IS NEEDED     FOR ANY PURPOSE, NOTIFY LAB     WITHIN 5 DAYS.                LOWEST DETECTABLE LIMITS     FOR URINE DRUG SCREEN     Drug Class       Cutoff (ng/mL)     Amphetamine      1000     Barbiturate      200     Benzodiazepine   200     Tricyclics       300     Opiates          300     Cocaine          300     THC              50  URINALYSIS, ROUTINE W REFLEX MICROSCOPIC     Status: None   Collection Time    04/14/13  2:35 AM      Result Value Range   Color, Urine YELLOW  YELLOW   APPearance CLEAR  CLEAR   Specific Gravity, Urine 1.020  1.005 - 1.030   pH 6.0  5.0 - 8.0   Glucose, UA NEGATIVE  NEGATIVE mg/dL   Hgb urine dipstick NEGATIVE  NEGATIVE   Bilirubin Urine NEGATIVE  NEGATIVE   Ketones, ur NEGATIVE  NEGATIVE mg/dL   Protein, ur NEGATIVE  NEGATIVE mg/dL   Urobilinogen, UA 0.2  0.0 - 1.0 mg/dL   Nitrite NEGATIVE  NEGATIVE   Leukocytes, UA NEGATIVE  NEGATIVE   Comment: MICROSCOPIC NOT DONE ON URINES WITH NEGATIVE PROTEIN, BLOOD, LEUKOCYTES, NITRITE, OR GLUCOSE <1000 mg/dL.  LIPID PANEL     Status: Abnormal   Collection Time    04/14/13  5:00 AM      Result Value Range   Cholesterol 133  0 - 200 mg/dL   Triglycerides 95  <161 mg/dL   HDL 38 (*) >09 mg/dL   Total CHOL/HDL Ratio 3.5     VLDL 19  0 - 40 mg/dL   LDL Cholesterol 76  0 - 99 mg/dL   Comment:            Total Cholesterol/HDL:CHD Risk     Coronary Heart Disease Risk Table                         Men   Women      1/2 Average Risk   3.4   3.3      Average Risk       5.0   4.4      2 X Average Risk   9.6   7.1      3 X Average Risk  23.4   11.0                Use the calculated Patient Ratio     above and the CHD Risk Table     to determine the patient's CHD Risk.                ATP III CLASSIFICATION (LDL):      <100      mg/dL   Optimal      161-096  mg/dL   Near or Above                        Optimal      130-159  mg/dL   Borderline      045-409  mg/dL   High      >811     mg/dL   Very High  HEMOGLOBIN A1C     Status: None   Collection Time    04/14/13  6:50 AM      Result Value Range   Hemoglobin A1C 5.6  <5.7 %   Comment: (NOTE)                                                                               According to the ADA Clinical Practice Recommendations for 2011, when     HbA1c is used as a screening test:      >=6.5%   Diagnostic of Diabetes Mellitus               (if abnormal result is confirmed)     5.7-6.4%   Increased risk of developing Diabetes Mellitus     References:Diagnosis and Classification of Diabetes Mellitus,Diabetes     Care,2011,34(Suppl 1):S62-S69 and Standards of Medical Care in             Diabetes - 2011,Diabetes Care,2011,34 (Suppl 1):S11-S61.   Mean Plasma Glucose 114  <117 mg/dL  TROPONIN I     Status: None   Collection Time    04/14/13  6:50 AM      Result Value Range   Troponin I <0.30  <0.30 ng/mL   Comment:            Due to the release kinetics of cTnI,     a negative result within the first hours     of the onset of symptoms does not rule out     myocardial infarction with certainty.     If myocardial infarction is still suspected,  repeat the test at appropriate intervals.  GLUCOSE, CAPILLARY     Status: Abnormal   Collection Time    04/14/13  9:19 PM      Result Value Range   Glucose-Capillary 102 (*) 70 - 99 mg/dL   Comment 1 Notify RN     Comment 2 Documented in Chart    GLUCOSE, CAPILLARY     Status: None   Collection Time    04/15/13  7:47 AM      Result Value Range   Glucose-Capillary 94  70 - 99 mg/dL   Comment 1 Documented in Chart     Comment 2 Notify RN      Ct Head Wo Contrast  04/14/2013  *RADIOLOGY REPORT*  Clinical Data: Dizziness.  CT HEAD WITHOUT CONTRAST  Technique:  Contiguous axial images were obtained from the base of  the skull through the vertex without contrast.  Comparison: None.  Findings: The brain appears normal without infarct, hemorrhage, mass lesion, mass effect, midline shift or abnormal extra-axial fluid collection.  No hydrocephalus or pneumocephalus.  The calvarium is intact.  IMPRESSION: Negative exam.   Original Report Authenticated By: Holley Dexter, M.D.    US Carotid Duplex Bilateral  04/14/2013  *RADIOLOGY REPORT*  Clinical Data: TIA and history of hypertension.  BILATERAL CAROTID DUPLEX ULTRASOUND  Technique: Wallace Cullens scale imaging, color Doppler and duplex ultrasound was performed of bilateral carotid and vertebral arteries in the neck.  Comparison:  None.  Criteria:  Quantification of carotid stenosis is based on velocity parameters that correlate the residual internal carotid diameter with NASCET-based stenosis levels, using the diameter of the distal internal carotid lumen as the denominator for stenosis measurement.  The following velocity measurements were obtained:                   PEAK SYSTOLIC/END DIASTOLIC RIGHT ICA:                        72/22cm/sec CCA:                        104/22cm/sec SYSTOLIC ICA/CCA RATIO:     0.7 DIASTOLIC ICA/CCA RATIO:    1.0 ECA:                        77cm/sec  LEFT ICA:                        67/23cm/sec CCA:                        103/24cm/sec SYSTOLIC ICA/CCA RATIO:     0.7 DIASTOLIC ICA/CCA RATIO:    1.0 ECA:                        67cm/sec  Findings:  RIGHT CAROTID ARTERY: There is a mild amount of predominately noncalcified plaque at the level of the carotid bulb and proximal ICA.  Estimated right ICA stenosis is less than 50%.  RIGHT VERTEBRAL ARTERY:  Antegrade flow with normal wave form.  LEFT CAROTID ARTERY: Carotid arteries are extremely tortuous in the neck.  Partially calcified plaque is present at the level of the bulb and proximal ICA.  Estimated left ICA stenosis is less than 50%.  LEFT VERTEBRAL ARTERY:  Antegrade flow with normal wave form.   IMPRESSION: Bilateral atherosclerotic plaque, left greater  than right. Tortuosity of the carotid arteries is present related to underlying hypertension.  Estimated bilateral ICA stenoses are less than 50%.   Original Report Authenticated By: Irish Lack, M.D.    Dg Chest Portable 1 View  04/13/2013  *RADIOLOGY REPORT*  Clinical Data: Shortness of breath.  PORTABLE CHEST - 1 VIEW  Comparison: PA and lateral chest 06/08/2010.  Findings: Lungs are clear.  Heart size is normal.  No pneumothorax or pleural effusion.  No focal bony abnormality.  IMPRESSION: No acute disease.   Original Report Authenticated By: Holley Dexter, M.D.         Daliah Chaudoin A. [name], M.D.  Diplomate, Biomedical engineer of Psychiatry and Neurology ( Neurology). 04/15/2013, 8:30 AM

## 2013-04-17 ENCOUNTER — Ambulatory Visit: Payer: Medicare PPO | Admitting: *Deleted

## 2013-04-17 DIAGNOSIS — R002 Palpitations: Secondary | ICD-10-CM

## 2013-04-17 NOTE — Patient Instructions (Signed)
Your physician has recommended that you wear an event monitor. Event monitors are medical devices that record the heart's electrical activity. Doctors most often [place] these monitors to diagnose arrhythmias. Arrhythmias are problems with the speed or rhythm of the heartbeat. The monitor is a small, portable device. You can wear one while you do your normal daily activities. This is usually used to diagnose what is causing palpitations/syncope (passing out).until 05-01-13   Once you have completed your session of the event monitor, remove all the equipment on your estimated discharge date, place in the box given at your office visit, peel the red/white sticker on the front of the box, close and mail in any standard mailbox per the USPS code is located on the bottom of the box, we will call you with the results or discuss at your next office visit per your doctor's decision.

## 2013-04-19 NOTE — Discharge Summary (Signed)
Physician Discharge Summary  Patient ID: Corey Nunez MRN: [id number] DOB/AGE: 1951-03-03 62 y.o. Primary Care Physician:Yasiel Goyne L, MD Admit date: 04/13/2013 Discharge date: 04/19/2013    Discharge Diagnoses:   Principal Problem:   Stroke-like symptoms Active Problems:   Ataxia   Headache   HTN (hypertension)   COPD (chronic obstructive pulmonary disease)   GERD (gastroesophageal reflux disease)   Sleep apnea   Arthritis   Venous insufficiency   Acute recurrent sinusitis     Medication List    TAKE these medications       amoxicillin-clavulanate 875-125 MG per tablet  Commonly known as:  AUGMENTIN  Take 1 tablet by mouth every 12 (twelve) hours.     lisinopril-hydrochlorothiazide 20-12.5 MG per tablet  Commonly known as:  PRINZIDE,ZESTORETIC  Take 1 tablet by mouth daily.     omega-3 acid ethyl esters 1 G capsule  Commonly known as:  LOVAZA  Take 1 g by mouth daily.     omeprazole 20 MG capsule  Commonly known as:  PRILOSEC  Take 20 mg by mouth daily.     traMADol 50 MG tablet  Commonly known as:  ULTRAM  Take 50 mg by mouth every 6 (six) hours as needed. For pain     vitamin C 500 MG tablet  Commonly known as:  ASCORBIC ACID  Take 500 mg by mouth 2 (two) times daily.        Discharged Condition: Improved    Consults: Neurology  Significant Diagnostic Studies: Ct Head Wo Contrast  04/14/2013   *RADIOLOGY REPORT*  Clinical Data: Dizziness.  CT HEAD WITHOUT CONTRAST  Technique:  Contiguous axial images were obtained from the base of the skull through the vertex without contrast.  Comparison: None.  Findings: The brain appears normal without infarct, hemorrhage, mass lesion, mass effect, midline shift or abnormal extra-axial fluid collection.  No hydrocephalus or pneumocephalus.  The calvarium is intact.  IMPRESSION: Negative exam.   Original Report Authenticated By: Holley Dexter, M.D.   Mr [facility] Wo Contrast  04/15/2013   *RADIOLOGY  REPORT*  Clinical Data:  Syncope with headache.  Ataxia.  MRI HEAD WITHOUT CONTRAST MRA HEAD WITHOUT CONTRAST  Technique:  Multiplanar, multiecho pulse sequences of the brain and surrounding structures were obtained without intravenous contrast. Angiographic images of the head were obtained using MRA technique without contrast.  Comparison:   None  MRI HEAD  Findings:  No acute stroke or hemorrhage.  No mass lesion or hydrocephalus.  No extra-axial fluid. Mild atrophy.  No significant white matter disease.  Pituitary is uplifted by dolichoectatic internal carotid arteries which project medially from the cavernous sinuses.  Posterior pituitary bright spot is slightly ectopic, likely related to the same process.  Calvarium is intact.  Cerebellar tonsils unremarkable.  No osseous findings.  No acute sinus or mastoid disease.  Bilateral cataract extraction.  IMPRESSION: Mild atrophy.  No significant white matter disease.  No acute intracranial findings.  Dolichoectatic cerebral vasculature results in slight uplifting of the otherwise normal pituitary.  MRA HEAD  Findings: The internal carotid arteries are dolichoectatic but widely patent.  The basilar artery is dolichoectatic but widely patent.  Both vertebrals contribute to its formation.  There is no intracranial stenosis or aneurysm. There is no proximal stenosis of the anterior, middle, or posterior cerebral arteries.  No cerebellar branch occlusion is evident.  IMPRESSION: Unremarkable MRA intracranial circulation.   Original Report Authenticated By: Davonna Belling, M.D.   Mr Brain Wo Contrast  04/15/2013   *RADIOLOGY REPORT*  Clinical Data:  Syncope with headache.  Ataxia.  MRI HEAD WITHOUT CONTRAST MRA HEAD WITHOUT CONTRAST  Technique:  Multiplanar, multiecho pulse sequences of the brain and surrounding structures were obtained without intravenous contrast. Angiographic images of the head were obtained using MRA technique without contrast.  Comparison:   None   MRI HEAD  Findings:  No acute stroke or hemorrhage.  No mass lesion or hydrocephalus.  No extra-axial fluid. Mild atrophy.  No significant white matter disease.  Pituitary is uplifted by dolichoectatic internal carotid arteries which project medially from the cavernous sinuses.  Posterior pituitary bright spot is slightly ectopic, likely related to the same process.  Calvarium is intact.  Cerebellar tonsils unremarkable.  No osseous findings.  No acute sinus or mastoid disease.  Bilateral cataract extraction.  IMPRESSION: Mild atrophy.  No significant white matter disease.  No acute intracranial findings.  Dolichoectatic cerebral vasculature results in slight uplifting of the otherwise normal pituitary.  MRA HEAD  Findings: The internal carotid arteries are dolichoectatic but widely patent.  The basilar artery is dolichoectatic but widely patent.  Both vertebrals contribute to its formation.  There is no intracranial stenosis or aneurysm. There is no proximal stenosis of the anterior, middle, or posterior cerebral arteries.  No cerebellar branch occlusion is evident.  IMPRESSION: Unremarkable MRA intracranial circulation.   Original Report Authenticated By: Davonna Belling, M.D.   US Carotid Duplex Bilateral  04/14/2013   *RADIOLOGY REPORT*  Clinical Data: TIA and history of hypertension.  BILATERAL CAROTID DUPLEX ULTRASOUND  Technique: Wallace Cullens scale imaging, color Doppler and duplex ultrasound was performed of bilateral carotid and vertebral arteries in the neck.  Comparison:  None.  Criteria:  Quantification of carotid stenosis is based on velocity parameters that correlate the residual internal carotid diameter with NASCET-based stenosis levels, using the diameter of the distal internal carotid lumen as the denominator for stenosis measurement.  The following velocity measurements were obtained:                   PEAK SYSTOLIC/END DIASTOLIC RIGHT ICA:                        72/22cm/sec CCA:                         104/22cm/sec SYSTOLIC ICA/CCA RATIO:     0.7 DIASTOLIC ICA/CCA RATIO:    1.0 ECA:                        77cm/sec  LEFT ICA:                        67/23cm/sec CCA:                        103/24cm/sec SYSTOLIC ICA/CCA RATIO:     0.7 DIASTOLIC ICA/CCA RATIO:    1.0 ECA:                        67cm/sec  Findings:  RIGHT CAROTID ARTERY: There is a mild amount of predominately noncalcified plaque at the level of the carotid bulb and proximal ICA.  Estimated right ICA stenosis is less than 50%.  RIGHT VERTEBRAL ARTERY:  Antegrade flow with normal wave form.  LEFT CAROTID ARTERY: Carotid arteries are extremely tortuous in the neck.  Partially calcified plaque is present at the level of the bulb and proximal ICA.  Estimated left ICA stenosis is less than 50%.  LEFT VERTEBRAL ARTERY:  Antegrade flow with normal wave form.  IMPRESSION: Bilateral atherosclerotic plaque, left greater than right. Tortuosity of the carotid arteries is present related to underlying hypertension.  Estimated bilateral ICA stenoses are less than 50%.   Original Report Authenticated By: Irish Lack, M.D.   Dg Chest Portable 1 View  04/13/2013   *RADIOLOGY REPORT*  Clinical Data: Shortness of breath.  PORTABLE CHEST - 1 VIEW  Comparison: PA and lateral chest 06/08/2010.  Findings: Lungs are clear.  Heart size is normal.  No pneumothorax or pleural effusion.  No focal bony abnormality.  IMPRESSION: No acute disease.   Original Report Authenticated By: Holley Dexter, M.D.    Lab Results: Basic Metabolic Panel: No results found for this basename: NA, K, CL, CO2, GLUCOSE, BUN, CREATININE, CALCIUM, MG, PHOS,  in the last 72 hours Liver Function Tests: No results found for this basename: AST, ALT, ALKPHOS, BILITOT, PROT, ALBUMIN,  in the last 72 hours   CBC: No results found for this basename: WBC, NEUTROABS, HGB, HCT, MCV, PLT,  in the last 72 hours  No results found for this or any previous visit (from the past 240 hour(s)).    Hospital Course: He came to the emergency room initially with palpitations and then said he also was having weakness. This seems consistent with possible TIA and he was brought into the hospital because of that. CT did not show any definite abnormality and he then underwent an MRI which also did not show any definite abnormality that would explain his symptoms. He had EEG that was pending at the time of discharge. Neurology consultation was obtained it was felt that this was most consistent with some sort of an ischemic event but that was prior to the MRI scan. At this point there is diagnostic uncertainty but he is much improved is not having any neurological symptoms now.  Discharge Exam: Blood pressure 157/87, pulse 61, temperature 98.1 F (36.7 C), temperature source Oral, resp. rate 20, height 6' (1.829 m), weight 116.574 kg (257 lb), SpO2 96.00%. He is awake and alert. His chest is clear. His heart is regular. Neurologically he is grossly intact  Disposition: Home. He will follow in my office in approximately a week. He will be set up for a cardiac event monitor because of his symptoms of palpitations preceding this event      Discharge Orders   Future Orders Complete By Expires     Discharge patient  As directed        Follow-up Information   Follow up with [facility] [place] On 05/01/2013. (appt with [email or web address] at 3:45)    Contact information:   [address] [place] [address] [phone]      Signed: Fredirick Maudlin Pager [phone]  04/19/2013, 8:14 AM

## 2013-05-02 NOTE — Procedures (Signed)
[facility] [place] A. [name], MD     [email or web address]        NAME:  Corey Nunez, Corey Nunez                ACCOUNT NO.:  [id number]  MEDICAL RECORD NO.:  [id number]  LOCATION:  EE                           FACILITY:  [facility]  PHYSICIAN:  Vidya Bamford A. [name], M.D. DATE OF BIRTH:  1951-04-16  DATE OF PROCEDURE:  04/15/2013 DATE OF DISCHARGE:  04/15/2013                             EEG INTERPRETATION   INDICATION:  This is a gentleman, who presented with couple spells of confusion and what appears to be focal strengthening of the right upper extremity.  These spells are suspicious for complex partial seizures.  MEDICATIONS:  List not available at this time.  ANALYSIS:  A 16 channel recording using standard 10/20 measurements is conducted for 22 minutes.  There is a posterior dominant rhythm of 10 Hz that attenuates with eye opening.  There is beta activity observed in the frontal areas.  Awake and sleep activities are recorded.  Stage II sleep with K complexes and sleep spindles are observed.  Photic stimulation is carried out without abnormal changes in the background activity.  There is no focal or lateralized slowing.  There is no epileptiform activity observed.  IMPRESSION:  Normal recording of awake and sleep states.     Marguita Venning A. [name], M.D.     KAD/MEDQ  D:  05/02/2013  T:  05/02/2013  Job:  [id number]

## 2013-05-09 ENCOUNTER — Other Ambulatory Visit: Payer: Self-pay | Admitting: *Deleted

## 2013-05-09 DIAGNOSIS — R002 Palpitations: Secondary | ICD-10-CM

## 2014-09-12 ENCOUNTER — Other Ambulatory Visit (HOSPITAL_COMMUNITY): Payer: Self-pay | Admitting: Pulmonary Disease

## 2014-09-12 DIAGNOSIS — I739 Peripheral vascular disease, unspecified: Secondary | ICD-10-CM

## 2014-09-17 ENCOUNTER — Ambulatory Visit (HOSPITAL_COMMUNITY)
Admission: RE | Admit: 2014-09-17 | Discharge: 2014-09-17 | Disposition: A | Discharge status: Home / Self Care | Payer: Medicare PPO | Source: Ambulatory Visit | Attending: Pulmonary Disease | Admitting: Pulmonary Disease

## 2014-09-17 ENCOUNTER — Other Ambulatory Visit (HOSPITAL_COMMUNITY): Payer: Self-pay | Admitting: Pulmonary Disease

## 2014-09-17 DIAGNOSIS — I739 Peripheral vascular disease, unspecified: Secondary | ICD-10-CM

## 2022-01-19 ENCOUNTER — Encounter: Payer: Self-pay | Admitting: *Deleted
